# Patient Record
Sex: Female | Born: 1950 | Race: White | Hispanic: No | Marital: Married | Smoking: Never smoker
Health system: Southern US, Community
[De-identification: ages and names within clinical notes are randomized; demographics above are authoritative.]

## PROBLEM LIST (undated history)

## (undated) DIAGNOSIS — E785 Hyperlipidemia, unspecified: Secondary | ICD-10-CM

## (undated) DIAGNOSIS — I509 Heart failure, unspecified: Secondary | ICD-10-CM

## (undated) DIAGNOSIS — M1990 Unspecified osteoarthritis, unspecified site: Secondary | ICD-10-CM

## (undated) DIAGNOSIS — I10 Essential (primary) hypertension: Secondary | ICD-10-CM

## (undated) DIAGNOSIS — T7840XA Allergy, unspecified, initial encounter: Secondary | ICD-10-CM

## (undated) DIAGNOSIS — E119 Type 2 diabetes mellitus without complications: Secondary | ICD-10-CM

## (undated) HISTORY — DX: Hyperlipidemia, unspecified: E78.5

## (undated) HISTORY — DX: Essential (primary) hypertension: I10

## (undated) HISTORY — DX: Type 2 diabetes mellitus without complications: E11.9

## (undated) HISTORY — PX: TUBAL LIGATION: SHX77

## (undated) HISTORY — PX: CARDIAC SURGERY: SHX584

## (undated) HISTORY — DX: Allergy, unspecified, initial encounter: T78.40XA

## (undated) HISTORY — DX: Unspecified osteoarthritis, unspecified site: M19.90

## (undated) HISTORY — PX: CHOLECYSTECTOMY: SHX55

## (undated) HISTORY — DX: Heart failure, unspecified: I50.9

---

## 2004-02-09 ENCOUNTER — Ambulatory Visit: Payer: Self-pay

## 2004-08-21 ENCOUNTER — Ambulatory Visit: Payer: Self-pay

## 2005-01-09 ENCOUNTER — Ambulatory Visit: Payer: Self-pay | Admitting: Internal Medicine

## 2005-01-24 ENCOUNTER — Ambulatory Visit: Payer: Self-pay | Admitting: Internal Medicine

## 2005-03-05 ENCOUNTER — Ambulatory Visit: Payer: Self-pay

## 2006-01-28 ENCOUNTER — Ambulatory Visit: Payer: Self-pay | Admitting: Internal Medicine

## 2007-01-31 ENCOUNTER — Ambulatory Visit: Payer: Self-pay | Admitting: Internal Medicine

## 2008-02-02 ENCOUNTER — Ambulatory Visit: Payer: Self-pay | Admitting: Internal Medicine

## 2009-02-07 ENCOUNTER — Ambulatory Visit: Payer: Self-pay | Admitting: Internal Medicine

## 2009-05-27 ENCOUNTER — Inpatient Hospital Stay: Payer: Self-pay | Admitting: Internal Medicine

## 2009-06-21 ENCOUNTER — Ambulatory Visit: Payer: Self-pay | Admitting: Specialist

## 2009-07-10 ENCOUNTER — Ambulatory Visit: Payer: Self-pay | Admitting: Specialist

## 2010-02-23 ENCOUNTER — Ambulatory Visit: Payer: Self-pay | Admitting: Internal Medicine

## 2010-07-10 ENCOUNTER — Ambulatory Visit: Payer: Self-pay | Admitting: Internal Medicine

## 2011-02-26 ENCOUNTER — Ambulatory Visit: Payer: Self-pay | Admitting: Internal Medicine

## 2012-02-27 ENCOUNTER — Ambulatory Visit: Payer: Self-pay

## 2013-02-27 ENCOUNTER — Ambulatory Visit: Payer: Self-pay

## 2014-03-02 ENCOUNTER — Ambulatory Visit: Payer: Self-pay | Admitting: Internal Medicine

## 2015-02-15 ENCOUNTER — Other Ambulatory Visit: Payer: Self-pay | Admitting: Internal Medicine

## 2015-02-15 DIAGNOSIS — Z1231 Encounter for screening mammogram for malignant neoplasm of breast: Secondary | ICD-10-CM

## 2015-03-07 ENCOUNTER — Ambulatory Visit
Admission: RE | Admit: 2015-03-07 | Discharge: 2015-03-07 | Disposition: A | Discharge status: Home / Self Care | Payer: Medicare Other | Source: Ambulatory Visit | Attending: Internal Medicine | Admitting: Internal Medicine

## 2015-03-07 ENCOUNTER — Other Ambulatory Visit: Payer: Self-pay | Admitting: Internal Medicine

## 2015-03-07 DIAGNOSIS — Z1231 Encounter for screening mammogram for malignant neoplasm of breast: Secondary | ICD-10-CM | POA: Insufficient documentation

## 2016-03-05 ENCOUNTER — Other Ambulatory Visit: Payer: Self-pay | Admitting: Internal Medicine

## 2016-03-05 DIAGNOSIS — Z1231 Encounter for screening mammogram for malignant neoplasm of breast: Secondary | ICD-10-CM

## 2016-04-06 ENCOUNTER — Ambulatory Visit
Admission: RE | Admit: 2016-04-06 | Discharge: 2016-04-06 | Disposition: A | Discharge status: Home / Self Care | Payer: Medicare Other | Source: Ambulatory Visit | Attending: Internal Medicine | Admitting: Internal Medicine

## 2016-04-06 DIAGNOSIS — Z1231 Encounter for screening mammogram for malignant neoplasm of breast: Secondary | ICD-10-CM | POA: Insufficient documentation

## 2016-12-11 ENCOUNTER — Other Ambulatory Visit: Payer: Self-pay | Admitting: Internal Medicine

## 2016-12-11 DIAGNOSIS — Z1231 Encounter for screening mammogram for malignant neoplasm of breast: Secondary | ICD-10-CM

## 2017-04-08 ENCOUNTER — Ambulatory Visit: Payer: Medicare Other

## 2017-04-26 ENCOUNTER — Ambulatory Visit
Admission: RE | Admit: 2017-04-26 | Discharge: 2017-04-26 | Disposition: A | Discharge status: Home / Self Care | Payer: Medicare Other | Source: Ambulatory Visit | Attending: Internal Medicine | Admitting: Internal Medicine

## 2017-04-26 DIAGNOSIS — Z1231 Encounter for screening mammogram for malignant neoplasm of breast: Secondary | ICD-10-CM

## 2017-08-29 ENCOUNTER — Other Ambulatory Visit: Payer: Self-pay | Admitting: Internal Medicine

## 2017-08-29 DIAGNOSIS — N183 Chronic kidney disease, stage 3 unspecified: Secondary | ICD-10-CM

## 2017-09-04 ENCOUNTER — Ambulatory Visit
Admission: RE | Admit: 2017-09-04 | Discharge: 2017-09-04 | Disposition: A | Discharge status: Home / Self Care | Payer: Medicare Other | Source: Ambulatory Visit | Attending: Internal Medicine | Admitting: Internal Medicine

## 2017-09-04 DIAGNOSIS — N183 Chronic kidney disease, stage 3 unspecified: Secondary | ICD-10-CM

## 2018-02-14 ENCOUNTER — Other Ambulatory Visit: Payer: Self-pay | Admitting: Internal Medicine

## 2018-02-14 DIAGNOSIS — Z1231 Encounter for screening mammogram for malignant neoplasm of breast: Secondary | ICD-10-CM

## 2018-05-02 ENCOUNTER — Ambulatory Visit
Admission: RE | Admit: 2018-05-02 | Discharge: 2018-05-02 | Disposition: A | Discharge status: Home / Self Care | Payer: Medicare Other | Source: Ambulatory Visit | Attending: Internal Medicine | Admitting: Internal Medicine

## 2018-05-02 DIAGNOSIS — Z1231 Encounter for screening mammogram for malignant neoplasm of breast: Secondary | ICD-10-CM | POA: Diagnosis not present

## 2018-07-15 ENCOUNTER — Other Ambulatory Visit (INDEPENDENT_AMBULATORY_CARE_PROVIDER_SITE_OTHER): Payer: Self-pay | Admitting: Vascular Surgery

## 2018-07-15 ENCOUNTER — Ambulatory Visit (INDEPENDENT_AMBULATORY_CARE_PROVIDER_SITE_OTHER): Payer: Medicare Other

## 2018-07-15 ENCOUNTER — Ambulatory Visit (INDEPENDENT_AMBULATORY_CARE_PROVIDER_SITE_OTHER): Payer: Medicare Other | Admitting: Vascular Surgery

## 2018-07-15 ENCOUNTER — Other Ambulatory Visit: Payer: Self-pay

## 2018-07-15 ENCOUNTER — Encounter (INDEPENDENT_AMBULATORY_CARE_PROVIDER_SITE_OTHER): Payer: Self-pay | Admitting: Vascular Surgery

## 2018-07-15 VITALS — BP 135/75 | HR 71 | Resp 16 | Ht 65.0 in | Wt 131.6 lb

## 2018-07-15 DIAGNOSIS — Z79899 Other long term (current) drug therapy: Secondary | ICD-10-CM

## 2018-07-15 DIAGNOSIS — I739 Peripheral vascular disease, unspecified: Secondary | ICD-10-CM

## 2018-07-15 DIAGNOSIS — M79604 Pain in right leg: Secondary | ICD-10-CM | POA: Diagnosis not present

## 2018-07-15 DIAGNOSIS — E119 Type 2 diabetes mellitus without complications: Secondary | ICD-10-CM | POA: Diagnosis not present

## 2018-07-15 DIAGNOSIS — E785 Hyperlipidemia, unspecified: Secondary | ICD-10-CM | POA: Diagnosis not present

## 2018-07-15 DIAGNOSIS — M79605 Pain in left leg: Secondary | ICD-10-CM

## 2018-07-15 DIAGNOSIS — Z794 Long term (current) use of insulin: Secondary | ICD-10-CM

## 2018-07-15 DIAGNOSIS — M79606 Pain in leg, unspecified: Secondary | ICD-10-CM | POA: Insufficient documentation

## 2018-07-15 DIAGNOSIS — I10 Essential (primary) hypertension: Secondary | ICD-10-CM | POA: Diagnosis not present

## 2018-07-15 NOTE — Assessment & Plan Note (Signed)
blood glucose control important in reducing the progression of atherosclerotic disease. Also, involved in wound healing. On appropriate medications.  

## 2018-07-15 NOTE — Assessment & Plan Note (Signed)
ABIs today are normal at 1.03 on the right and 0.99 on the left.  No signs of PAD. She does complain of painful varicosities and we will be happy to check this in a few months with duplex.  Compression stockings, leg elevation and anti-inflammatories for pain.

## 2018-07-15 NOTE — Assessment & Plan Note (Signed)
lipid control important in reducing the progression of atherosclerotic disease. Continue statin therapy  

## 2018-07-15 NOTE — Progress Notes (Signed)
Patient ID: Vickie Phillips, female   DOB: 10/04/1950, 68 y.o.   MRN: [id number]  Chief Complaint  Patient presents with   New Patient (Initial Visit)    ref singh for PAD with abi    HPI Vickie Kochererry W Minahan is a 68 y.o. female.  I am asked to see the patient by Dr. Thedore MinsSingh for evaluation of PAD.  The patient says a home health nurse came out and did a test and told her she had at least moderate peripheral arterial disease.  This is very disconcerting to her.  She was not having any claudication symptoms.  She was not having any rest pain symptoms.  No history of ulceration or infection.  She does complain of some painful varicosities in both legs that she has had treated many years ago without great success.  These are persistent.  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  No history of blood clots or thrombophlebitis to her knowledge.  No fevers or chills.  We performed noninvasive studies today which showed normal ABIs of 1.03 on the right and 0.99 on the left with normal waveforms and digital pressures consistent with no arterial insufficiency..     Past Medical History:  Diagnosis Date   Allergy    Arthritis    CHF (congestive heart failure) (HCC)    Diabetes mellitus without complication (HCC)    Hyperlipidemia    Hypertension     Past Surgical History:  Procedure Laterality Date   CARDIAC SURGERY     CHOLECYSTECTOMY     TUBAL LIGATION      Family History Family History  Problem Relation Age of Onset   Breast cancer Mother [id number]   Breast cancer Maternal Aunt 60   No bleeding or clotting disorders  Social History Social History   Tobacco Use   Smoking status: Never Smoker   Smokeless tobacco: Never Used  Substance Use Topics   Alcohol use: Never    Frequency: Never   Drug use: Never    Allergies  Allergen Reactions   Atenolol Other (See Comments)    Other Reaction: RESP.DISTRESS   Beta Adrenergic Blockers Other (See Comments)    Other  Reaction: resp. distress   Clarithromycin Rash   Hydrocodone-Acetaminophen Other (See Comments)    Other Reaction: NIGHTMARES   Zolpidem Other (See Comments)    Other Reaction: HALLUCINATIONS & NIGHTMARES   Metronidazole Other (See Comments)   Insulin Detemir Itching and Rash   Insulin Glargine Itching and Rash    Current Outpatient Medications  Medication Sig Dispense Refill   acetaminophen (TYLENOL) 500 MG tablet Take by mouth.     amoxicillin (AMOXIL) 500 MG tablet 4 tablets po one hour before dental work.     Calcium Carbonate Antacid (TUMS PO) Take by mouth.     Calcium Carbonate-Vitamin D (CALCIUM HIGH POTENCY/VITAMIN D) 600-200 MG-UNIT TABS Take by mouth.     Cholecalciferol (VITAMIN D3) 25 MCG (1000 UT) CAPS Take by mouth daily.     digoxin (LANOXIN) 0.125 MG tablet TAKE 1 TABLET BY MOUTH  DAILY     diltiazem (CARDIZEM CD) 240 MG 24 hr capsule TAKE 1 CAPSULE BY MOUTH  DAILY     dofetilide (TIKOSYN) 250 MCG capsule Take by mouth.     furosemide (LASIX) 20 MG tablet      glipiZIDE (GLUCOTROL) 10 MG tablet TAKE 1 TABLET BY MOUTH TWO  TIMES DAILY BEFORE MEALS     insulin NPH Human (HUMULIN  N,NOVOLIN N) 100 UNIT/ML injection Inject 8 units in the morning and 40 units in the evening. Take at 8 AM and 8 PM.     Insulin Pen Needle (FIFTY50 PEN NEEDLES) 32G X 4 MM MISC Use 1 each 2 (two) times daily     magnesium oxide (MAG-OX) 400 MG tablet Take 400 mg by mouth daily.     metFORMIN (GLUCOPHAGE-XR) 500 MG 24 hr tablet TAKE 2 TABLETS BY MOUTH TWO TIMES DAILY     Omega-3 Fatty Acids (FISH OIL) 1000 MG CAPS Take 1,000 mg by mouth.     ONE TOUCH ULTRA TEST test strip      potassium chloride SA (K-DUR,KLOR-CON) 20 MEQ tablet      pravastatin (PRAVACHOL) 40 MG tablet      spironolactone (ALDACTONE) 25 MG tablet TAKE 1 TABLET BY MOUTH ONCE DAILY     tadalafil, PAH, (ADCIRCA) 20 MG tablet Take by mouth.     warfarin (COUMADIN) 1 MG tablet TAKE 1 AND 1/2 TABLETS BY    MOUTH DAILY ALONG WITH 5MG   TABLET AND 3MG  TABLET   (9.5MG  TOTAL DAILY)     warfarin (COUMADIN) 3 MG tablet      warfarin (COUMADIN) 5 MG tablet TAKE 1 TABLET BY MOUTH   DAILY ALONG WITH 3MG  TABLET AND 1 AND 1/2 OF 1MG  (9.5MG  TOTAL DAILY)     No current facility-administered medications for this visit.       REVIEW OF SYSTEMS (Negative unless checked)  Constitutional: [] Weight loss  [] Fever  [] Chills Cardiac: [] Chest pain   [] Chest pressure   [] Palpitations   [] Shortness of breath when laying flat   [] Shortness of breath at rest   [] Shortness of breath with exertion. Vascular:  [] Pain in legs with walking   [] Pain in legs at rest   [] Pain in legs when laying flat   [] Claudication   [] Pain in feet when walking  [] Pain in feet at rest  [] Pain in feet when laying flat   [] History of DVT   [] Phlebitis   [x] Swelling in legs   [x] Varicose veins   [] Non-healing ulcers Pulmonary:   [] Uses home oxygen   [] Productive cough   [] Hemoptysis   [] Wheeze  [] COPD   [] Asthma Neurologic:  [] Dizziness  [] Blackouts   [] Seizures   [] History of stroke   [] History of TIA  [] Aphasia   [] Temporary blindness   [] Dysphagia   [] Weakness or numbness in arms   [] Weakness or numbness in legs Musculoskeletal:  [x] Arthritis   [] Joint swelling   [] Joint pain   [] Low back pain Hematologic:  [] Easy bruising  [] Easy bleeding   [] Hypercoagulable state   [] Anemic  [] Hepatitis Gastrointestinal:  [] Blood in stool   [] Vomiting blood  [] Gastroesophageal reflux/heartburn   [] Abdominal pain Genitourinary:  [] Chronic kidney disease   [] Difficult urination  [] Frequent urination  [] Burning with urination   [] Hematuria Skin:  [] Rashes   [] Ulcers   [] Wounds Psychological:  [] History of anxiety   []  History of major depression.    Physical Exam BP 135/75 (BP Location: Right Arm)    Pulse 71    Resp 16    Ht 5\' 5"  (1.651 m)    Wt 131 lb 9.6 oz (59.7 kg)    BMI 21.90 kg/m  Gen:  WD/WN, NAD Head: [place]/AT, No temporalis  wasting. Ear/Nose/Throat: Hearing grossly intact, nares w/o erythema or drainage, oropharynx w/o Erythema/Exudate Eyes: Conjunctiva clear, sclera non-icteric  Neck: trachea midline.  No JVD.  Pulmonary:  Good air movement, respirations not  labored, no use of accessory muscles  Cardiac: RRR, no JVD Vascular: diffuse varicosities bilaterally L>R Vessel Right Left  Radial Palpable Palpable                          DP 2+ 2+  PT 2+  1+    Gastrointestinal:. No masses, surgical incisions, or scars. Musculoskeletal: M/S 5/5 throughout.  Extremities without ischemic changes.  No deformity or atrophy. No edema. Neurologic: Sensation grossly intact in extremities.  Symmetrical.  Speech is fluent. Motor exam as listed above. Psychiatric: Judgment intact, Mood & affect appropriate for pt's clinical situation. Dermatologic: No rashes or ulcers noted.  No cellulitis or open wounds.    Radiology No results found.  Labs No results found for this or any previous visit (from the past 2160 hour(s)).  Assessment/Plan:  Hyperlipidemia lipid control important in reducing the progression of atherosclerotic disease. Continue statin therapy   Essential hypertension blood pressure control important in reducing the progression of atherosclerotic disease. On appropriate oral medications.   Diabetes (HCC) blood glucose control important in reducing the progression of atherosclerotic disease. Also, involved in wound healing. On appropriate medications.   Leg pain ABIs today are normal at 1.03 on the right and 0.99 on the left.  No signs of PAD. She does complain of painful varicosities and we will be happy to check this in a few months with duplex.  Compression stockings, leg elevation and anti-inflammatories for pain.       Festus Barren 07/15/2018, 9:43 AM   This note was created with Dragon medical transcription system.  Any errors from dictation are unintentional.

## 2018-07-15 NOTE — Assessment & Plan Note (Signed)
blood pressure control important in reducing the progression of atherosclerotic disease. On appropriate oral medications.  

## 2018-07-15 NOTE — Patient Instructions (Signed)
Varicose Veins Varicose veins are veins that have become enlarged, bulged, and twisted. They most often appear in the legs. What are the causes? This condition is caused by damage to the valves in the vein. These valves help blood return to your heart. When they are damaged and they stop working properly, blood may flow backward and back up in the veins near the skin, causing the veins to get larger and appear twisted. The condition can result from any issue that causes blood to back up, like pregnancy, prolonged standing, or obesity. What increases the risk? This condition is more likely to develop in people who are:  On their feet a lot.  Pregnant.  Overweight. What are the signs or symptoms? Symptoms of this condition include:  Bulging, twisted, and bluish veins.  A feeling of heaviness. This may be worse at the end of the day.  Leg pain. This may be worse at the end of the day.  Swelling in the leg.  Changes in skin color over the veins. How is this diagnosed? This condition may be diagnosed based on your symptoms, a physical exam, and an ultrasound test. How is this treated? Treatment for this condition may involve:  Avoiding sitting or standing in one position for long periods of time.  Wearing compression stockings. These stockings help to prevent blood clots and reduce swelling in the legs.  Raising (elevating) the legs when resting.  Losing weight.  Exercising regularly. If you have persistent symptoms or want to improve the way your varicose veins look, you may choose to have a procedure to close the varicose veins off or to remove them. Treatments to close off the veins include:  Sclerotherapy. In this treatment, a solution is injected into a vein to close it off.  Laser treatment. In this treatment, the vein is heated with a laser to close it off.  Radiofrequency vein ablation. In this treatment, an electrical current produced by radio waves is used to close  off the vein. Treatments to remove the veins include:  Phlebectomy. In this treatment, the veins are removed through small incisions made over the veins.  Vein ligation and stripping. In this treatment, incisions are made over the veins. The veins are then removed after being tied (ligated) with stitches (sutures). Follow these instructions at home: Activity  Walk as much as possible. Walking increases blood flow. This helps blood return to the heart and takes pressure off your veins. It also increases your cardiovascular strength.  Follow your health care provider's instructions about exercising.  Do not stand or sit in one position for a long period of time.  Do not sit with your legs crossed.  Rest with your legs raised during the day. General instructions   Follow any diet instructions given to you by your health care provider.  Wear compression stockings as directed by your health care provider. Do not wear other kinds of tight clothing around your legs, pelvis, or waist.  Elevate your legs at night to above the level of your heart.  If you get a cut in the skin over the varicose vein and the vein bleeds: ? Lie down with your leg raised. ? Apply firm pressure to the cut with a clean cloth until the bleeding stops. ? Place a bandage (dressing) on the cut. Contact a health care provider if:  The skin around your varicose veins starts to break down.  You have pain, redness, tenderness, or hard swelling over a vein.  You   are uncomfortable because of pain.  You get a cut in the skin over a varicose vein and it will not stop bleeding. Summary  Varicose veins are veins that have become enlarged, bulged, and twisted. They most often appear in the legs.  This condition is caused by damage to the valves in the vein. These valves help blood return to your heart.  Treatment for this condition includes frequent movements, wearing compression stockings, losing weight, and  exercising regularly. In some cases, procedures are done to close off or remove the veins.  Treatment for this condition may include wearing compression stockings, elevating the legs, losing weight, and engaging in regular activity. In some cases, procedures are done to close off or remove the veins. This information is not intended to replace advice given to you by your health care provider. Make sure you discuss any questions you have with your health care provider. Document Released: 01/24/2005 Document Revised: 05/09/2016 Document Reviewed: 05/09/2016 [employer] Interactive Patient Education  2019 [employer]  

## 2018-10-17 ENCOUNTER — Ambulatory Visit (INDEPENDENT_AMBULATORY_CARE_PROVIDER_SITE_OTHER): Payer: Medicare Other | Admitting: Vascular Surgery

## 2018-10-17 ENCOUNTER — Encounter (INDEPENDENT_AMBULATORY_CARE_PROVIDER_SITE_OTHER): Payer: Medicare Other

## 2019-03-23 ENCOUNTER — Other Ambulatory Visit: Payer: Self-pay | Admitting: Internal Medicine

## 2019-03-23 DIAGNOSIS — Z1231 Encounter for screening mammogram for malignant neoplasm of breast: Secondary | ICD-10-CM

## 2019-05-29 ENCOUNTER — Ambulatory Visit
Admission: RE | Admit: 2019-05-29 | Discharge: 2019-05-29 | Disposition: A | Discharge status: Home / Self Care | Payer: Medicare Other | Source: Ambulatory Visit | Attending: Internal Medicine | Admitting: Internal Medicine

## 2019-05-29 DIAGNOSIS — Z1231 Encounter for screening mammogram for malignant neoplasm of breast: Secondary | ICD-10-CM | POA: Diagnosis not present

## 2020-04-25 ENCOUNTER — Other Ambulatory Visit: Payer: Self-pay | Admitting: Internal Medicine

## 2020-04-25 DIAGNOSIS — Z1231 Encounter for screening mammogram for malignant neoplasm of breast: Secondary | ICD-10-CM

## 2020-05-31 ENCOUNTER — Other Ambulatory Visit: Payer: Self-pay

## 2020-05-31 ENCOUNTER — Ambulatory Visit
Admission: RE | Admit: 2020-05-31 | Discharge: 2020-05-31 | Disposition: A | Discharge status: Home / Self Care | Payer: Medicare Other | Source: Ambulatory Visit | Attending: Internal Medicine | Admitting: Internal Medicine

## 2020-05-31 DIAGNOSIS — Z1231 Encounter for screening mammogram for malignant neoplasm of breast: Secondary | ICD-10-CM | POA: Insufficient documentation

## 2021-04-08 ENCOUNTER — Emergency Department: Payer: Medicare Other

## 2021-04-08 ENCOUNTER — Observation Stay
Admission: EM | Admit: 2021-04-08 | Discharge: 2021-04-09 | Disposition: A | Discharge status: Home / Self Care | Payer: Medicare Other | Attending: Student | Admitting: Student

## 2021-04-08 ENCOUNTER — Encounter: Payer: Self-pay | Admitting: Emergency Medicine

## 2021-04-08 DIAGNOSIS — Z7901 Long term (current) use of anticoagulants: Secondary | ICD-10-CM | POA: Insufficient documentation

## 2021-04-08 DIAGNOSIS — R5383 Other fatigue: Secondary | ICD-10-CM | POA: Diagnosis present

## 2021-04-08 DIAGNOSIS — I110 Hypertensive heart disease with heart failure: Secondary | ICD-10-CM | POA: Diagnosis not present

## 2021-04-08 DIAGNOSIS — I509 Heart failure, unspecified: Secondary | ICD-10-CM | POA: Insufficient documentation

## 2021-04-08 DIAGNOSIS — U071 COVID-19: Secondary | ICD-10-CM | POA: Diagnosis not present

## 2021-04-08 DIAGNOSIS — I480 Paroxysmal atrial fibrillation: Principal | ICD-10-CM | POA: Insufficient documentation

## 2021-04-08 DIAGNOSIS — E119 Type 2 diabetes mellitus without complications: Secondary | ICD-10-CM | POA: Diagnosis not present

## 2021-04-08 DIAGNOSIS — Z79899 Other long term (current) drug therapy: Secondary | ICD-10-CM | POA: Diagnosis not present

## 2021-04-08 DIAGNOSIS — Z7984 Long term (current) use of oral hypoglycemic drugs: Secondary | ICD-10-CM | POA: Diagnosis not present

## 2021-04-08 DIAGNOSIS — Z794 Long term (current) use of insulin: Secondary | ICD-10-CM | POA: Diagnosis not present

## 2021-04-08 DIAGNOSIS — E785 Hyperlipidemia, unspecified: Secondary | ICD-10-CM

## 2021-04-08 DIAGNOSIS — I4891 Unspecified atrial fibrillation: Secondary | ICD-10-CM | POA: Diagnosis present

## 2021-04-08 LAB — CBC WITH DIFFERENTIAL/PLATELET
Abs Immature Granulocytes: 0.04 10*3/uL (ref 0.00–0.07)
Basophils Absolute: 0.1 10*3/uL (ref 0.0–0.1)
Basophils Relative: 1 %
Eosinophils Absolute: 0.1 10*3/uL (ref 0.0–0.5)
Eosinophils Relative: 1 %
HCT: 35.3 % — ABNORMAL LOW (ref 36.0–46.0)
Hemoglobin: 11.7 g/dL — ABNORMAL LOW (ref 12.0–15.0)
Immature Granulocytes: 1 %
Lymphocytes Relative: 11 %
Lymphs Abs: 0.7 10*3/uL (ref 0.7–4.0)
MCH: 28.1 pg (ref 26.0–34.0)
MCHC: 33.1 g/dL (ref 30.0–36.0)
MCV: 84.7 fL (ref 80.0–100.0)
Monocytes Absolute: 0.7 10*3/uL (ref 0.1–1.0)
Monocytes Relative: 11 %
Neutro Abs: 4.6 10*3/uL (ref 1.7–7.7)
Neutrophils Relative %: 75 %
Platelets: 219 10*3/uL (ref 150–400)
RBC: 4.17 MIL/uL (ref 3.87–5.11)
RDW: 13.6 % (ref 11.5–15.5)
WBC: 6.1 10*3/uL (ref 4.0–10.5)
nRBC: 0 % (ref 0.0–0.2)

## 2021-04-08 LAB — PROTIME-INR
INR: 1.9 — ABNORMAL HIGH (ref 0.8–1.2)
Prothrombin Time: 21.7 seconds — ABNORMAL HIGH (ref 11.4–15.2)

## 2021-04-08 LAB — LACTATE DEHYDROGENASE: LDH: 375 U/L — ABNORMAL HIGH (ref 98–192)

## 2021-04-08 LAB — FIBRINOGEN: Fibrinogen: 496 mg/dL — ABNORMAL HIGH (ref 210–475)

## 2021-04-08 LAB — BASIC METABOLIC PANEL
Anion gap: 9 (ref 5–15)
BUN: 19 mg/dL (ref 8–23)
CO2: 21 mmol/L — ABNORMAL LOW (ref 22–32)
Calcium: 8.5 mg/dL — ABNORMAL LOW (ref 8.9–10.3)
Chloride: 101 mmol/L (ref 98–111)
Creatinine, Ser: 0.93 mg/dL (ref 0.44–1.00)
GFR, Estimated: >60 mL/min (ref >60)
Glucose, Bld: 188 mg/dL — ABNORMAL HIGH (ref 70–99)
Potassium: 4.1 mmol/L (ref 3.5–5.1)
Sodium: 131 mmol/L — ABNORMAL LOW (ref 135–145)

## 2021-04-08 LAB — DIGOXIN LEVEL: Digoxin Level: <0.2 ng/mL — ABNORMAL LOW (ref 0.8–2.0)

## 2021-04-08 LAB — RESP PANEL BY RT-PCR (FLU A&B, COVID) ARPGX2
Influenza A by PCR: NEGATIVE
Influenza B by PCR: NEGATIVE
SARS Coronavirus 2 by RT PCR: POSITIVE — AB

## 2021-04-08 LAB — D-DIMER, QUANTITATIVE: D-Dimer, Quant: 1.13 ug/mL-FEU — ABNORMAL HIGH (ref 0.00–0.50)

## 2021-04-08 LAB — TROPONIN I (HIGH SENSITIVITY): Troponin I (High Sensitivity): 11 ng/L (ref <18)

## 2021-04-08 LAB — LACTIC ACID, PLASMA: Lactic Acid, Venous: 1.2 mmol/L (ref 0.5–1.9)

## 2021-04-08 LAB — PROCALCITONIN: Procalcitonin: <0.1 ng/mL

## 2021-04-08 LAB — FERRITIN: Ferritin: 140 ng/mL (ref 11–307)

## 2021-04-08 LAB — C-REACTIVE PROTEIN: CRP: 4.1 mg/dL — ABNORMAL HIGH (ref <1.0)

## 2021-04-08 LAB — BRAIN NATRIURETIC PEPTIDE: B Natriuretic Peptide: 225 pg/mL — ABNORMAL HIGH (ref 0.0–100.0)

## 2021-04-08 MED ORDER — HYDROCOD POLST-CPM POLST ER 10-8 MG/5ML PO SUER: 5.0000 mL | Freq: Two times a day (BID) | ORAL | Status: DC | PRN

## 2021-04-08 MED ORDER — DULAGLUTIDE 1.5 MG/0.5ML ~~LOC~~ SOAJ: 1.5000 mg | SUBCUTANEOUS | Status: DC

## 2021-04-08 MED ORDER — ASPIRIN EC 81 MG PO TBEC
81.0000 mg | DELAYED_RELEASE_TABLET | Freq: Every day | ORAL | Status: DC
  Filled 2021-04-08: qty 1

## 2021-04-08 MED ORDER — WARFARIN - PHYSICIAN DOSING INPATIENT
Freq: Every day | Status: DC
  Filled 2021-04-08: qty 1

## 2021-04-08 MED ORDER — DILTIAZEM HCL 25 MG/5ML IV SOLN
10.0000 mg | Freq: Once | INTRAVENOUS | Status: AC
  Administered 2021-04-08: 10 mg via INTRAVENOUS
  Filled 2021-04-08: qty 5

## 2021-04-08 MED ORDER — PRAVASTATIN SODIUM 40 MG PO TABS
40.0000 mg | ORAL_TABLET | Freq: Every day | ORAL | Status: DC
  Administered 2021-04-08: 40 mg via ORAL
  Filled 2021-04-08: qty 2

## 2021-04-08 MED ORDER — WARFARIN SODIUM 7.5 MG PO TABS
7.5000 mg | ORAL_TABLET | Freq: Every day | ORAL | Status: DC
  Filled 2021-04-08: qty 1

## 2021-04-08 MED ORDER — CALCIUM CARBONATE ANTACID 500 MG PO CHEW: 1.0000 | CHEWABLE_TABLET | Freq: Three times a day (TID) | ORAL | Status: DC | PRN

## 2021-04-08 MED ORDER — INSULIN GLARGINE-YFGN 100 UNIT/ML ~~LOC~~ SOLN
15.0000 [IU] | Freq: Every day | SUBCUTANEOUS | Status: DC
  Administered 2021-04-08: 15 [IU] via SUBCUTANEOUS
  Filled 2021-04-08 (×2): qty 0.15

## 2021-04-08 MED ORDER — GLIPIZIDE 10 MG PO TABS
10.0000 mg | ORAL_TABLET | Freq: Every day | ORAL | Status: DC
  Administered 2021-04-08: 10 mg via ORAL
  Filled 2021-04-08 (×2): qty 1

## 2021-04-08 MED ORDER — ZINC SULFATE 220 (50 ZN) MG PO CAPS
220.0000 mg | ORAL_CAPSULE | Freq: Every day | ORAL | Status: DC
  Administered 2021-04-08 – 2021-04-09 (×2): 220 mg via ORAL
  Filled 2021-04-08 (×2): qty 1

## 2021-04-08 MED ORDER — SPIRONOLACTONE 25 MG PO TABS
25.0000 mg | ORAL_TABLET | Freq: Every day | ORAL | Status: DC
  Administered 2021-04-08 – 2021-04-09 (×2): 25 mg via ORAL
  Filled 2021-04-08 (×2): qty 1

## 2021-04-08 MED ORDER — WARFARIN SODIUM 7.5 MG PO TABS
9.5000 mg | ORAL_TABLET | Freq: Every day | ORAL | Status: DC
  Administered 2021-04-08: 9.5 mg via ORAL
  Filled 2021-04-08 (×2): qty 1

## 2021-04-08 MED ORDER — TADALAFIL (PAH) 20 MG PO TABS
20.0000 mg | ORAL_TABLET | Freq: Every day | ORAL | Status: DC
  Administered 2021-04-08 – 2021-04-09 (×2): 20 mg via ORAL
  Filled 2021-04-08 (×2): qty 1

## 2021-04-08 MED ORDER — ASCORBIC ACID 500 MG PO TABS
500.0000 mg | ORAL_TABLET | Freq: Every day | ORAL | Status: DC
  Administered 2021-04-08 – 2021-04-09 (×2): 500 mg via ORAL
  Filled 2021-04-08 (×2): qty 1

## 2021-04-08 MED ORDER — ACETAMINOPHEN 325 MG PO TABS: 650.0000 mg | ORAL_TABLET | Freq: Four times a day (QID) | ORAL | Status: DC | PRN

## 2021-04-08 MED ORDER — GUAIFENESIN-DM 100-10 MG/5ML PO SYRP: 10.0000 mL | ORAL_SOLUTION | ORAL | Status: DC | PRN

## 2021-04-08 MED ORDER — DILTIAZEM HCL-DEXTROSE 125-5 MG/125ML-% IV SOLN (PREMIX)
5.0000 mg/h | INTRAVENOUS | Status: DC
  Administered 2021-04-08: 5 mg/h via INTRAVENOUS
  Filled 2021-04-08: qty 125

## 2021-04-08 MED ORDER — MAGNESIUM OXIDE -MG SUPPLEMENT 400 (240 MG) MG PO TABS
400.0000 mg | ORAL_TABLET | Freq: Every day | ORAL | Status: DC
  Administered 2021-04-08 – 2021-04-09 (×2): 400 mg via ORAL
  Filled 2021-04-08 (×2): qty 1

## 2021-04-08 MED ORDER — FUROSEMIDE 20 MG PO TABS
20.0000 mg | ORAL_TABLET | Freq: Every day | ORAL | Status: DC
  Administered 2021-04-08 – 2021-04-09 (×2): 20 mg via ORAL
  Filled 2021-04-08 (×2): qty 1

## 2021-04-08 MED ORDER — SODIUM CHLORIDE 0.9 % IV SOLN
100.0000 mg | Freq: Every day | INTRAVENOUS | Status: DC
  Filled 2021-04-08: qty 20

## 2021-04-08 MED ORDER — FAMOTIDINE 20 MG PO TABS
20.0000 mg | ORAL_TABLET | Freq: Two times a day (BID) | ORAL | Status: DC
  Administered 2021-04-08 – 2021-04-09 (×2): 20 mg via ORAL
  Filled 2021-04-08 (×2): qty 1

## 2021-04-08 MED ORDER — MAGNESIUM HYDROXIDE 400 MG/5ML PO SUSP: 30.0000 mL | Freq: Every day | ORAL | Status: DC | PRN

## 2021-04-08 MED ORDER — SODIUM CHLORIDE 0.9 % IV SOLN: INTRAVENOUS | Status: DC

## 2021-04-08 MED ORDER — TRAZODONE HCL 50 MG PO TABS: 25.0000 mg | ORAL_TABLET | Freq: Every evening | ORAL | Status: DC | PRN

## 2021-04-08 MED ORDER — DILTIAZEM HCL ER COATED BEADS 180 MG PO CP24
360.0000 mg | ORAL_CAPSULE | Freq: Once | ORAL | Status: AC
  Administered 2021-04-08: 360 mg via ORAL
  Filled 2021-04-08: qty 2

## 2021-04-08 MED ORDER — SODIUM CHLORIDE 0.9 % IV BOLUS
250.0000 mL | Freq: Once | INTRAVENOUS | Status: AC
  Administered 2021-04-08: 250 mL via INTRAVENOUS

## 2021-04-08 MED ORDER — SODIUM CHLORIDE 0.9 % IV SOLN
200.0000 mg | Freq: Once | INTRAVENOUS | Status: DC
  Filled 2021-04-08: qty 40

## 2021-04-08 MED ORDER — ONDANSETRON HCL 4 MG/2ML IJ SOLN: 4.0000 mg | Freq: Four times a day (QID) | INTRAMUSCULAR | Status: DC | PRN

## 2021-04-08 MED ORDER — DOFETILIDE 250 MCG PO CAPS
250.0000 ug | ORAL_CAPSULE | Freq: Two times a day (BID) | ORAL | Status: DC
Start: 2021-04-08 — End: 2021-04-09
  Administered 2021-04-08 – 2021-04-09 (×3): 250 ug via ORAL
  Filled 2021-04-08 (×6): qty 1

## 2021-04-08 MED ORDER — OMEGA-3-ACID ETHYL ESTERS 1 G PO CAPS
1.0000 g | ORAL_CAPSULE | Freq: Every day | ORAL | Status: DC
  Administered 2021-04-08 – 2021-04-09 (×2): 1 g via ORAL
  Filled 2021-04-08 (×3): qty 1

## 2021-04-08 MED ORDER — ONDANSETRON HCL 4 MG PO TABS: 4.0000 mg | ORAL_TABLET | Freq: Four times a day (QID) | ORAL | Status: DC | PRN

## 2021-04-08 MED ORDER — VITAMIN D 25 MCG (1000 UNIT) PO TABS
1000.0000 [IU] | ORAL_TABLET | Freq: Every day | ORAL | Status: DC
  Administered 2021-04-08 – 2021-04-09 (×2): 1000 [IU] via ORAL
  Filled 2021-04-08 (×2): qty 1

## 2021-04-08 MED ORDER — DILTIAZEM HCL 25 MG/5ML IV SOLN
10.0000 mg | Freq: Once | INTRAVENOUS | Status: AC
  Administered 2021-04-08: 10 mg via INTRAVENOUS

## 2021-04-08 MED ORDER — ENOXAPARIN SODIUM 30 MG/0.3ML IJ SOSY: 30.0000 mg | PREFILLED_SYRINGE | INTRAMUSCULAR | Status: DC

## 2021-04-08 NOTE — ED Provider Notes (Signed)
[facility] Emergency Department Provider Note   ____________________________________________   Event Date/Time   First MD Initiated Contact with Patient 04/08/21 1022     (approximate)  I have reviewed the triage vital signs and the nursing notes.   HISTORY  Chief Complaint No chief complaint on file.    HPI Rene Kochererry W Crock is a [age] y.o. female here for elevated heart rate  Patient reports she noticed her heart rate was fast and a bit irregular again.  She was in [facility] and had atrial flutter, her pacemaker site  She home tested positive for COVID a couple days ago.  She and her husband both developed feeling of fatigue.  She also had a slight sore throat and decreased appetite starting around Wednesday and Thursday of this week.  Overall though the symptoms are proved, she is not feeling short of breath.  She has been slightly decreased in her normal intake but still drinking fluids well  No chest pain.  Denies trouble breathing.  No weakness except for just slight fatigue.  Concerned now that she may be "back in a flutter" and previously had to do 2 cardioversions and had a several week stay at [facility].  [facility]  No longer on digoxin.  Takes tykosin and and Cardizem not had any of her morning medicine  Past Medical History:  Diagnosis Date   Allergy    Arthritis    CHF (congestive heart failure) (HCC)    Diabetes mellitus without complication (HCC)    Hyperlipidemia    Hypertension     Patient Active Problem List   Diagnosis Date Noted   Atrial fibrillation with RVR (HCC) 04/08/2021   Hyperlipidemia 07/15/2018   Essential hypertension 07/15/2018   Diabetes (HCC) 07/15/2018   Leg pain 07/15/2018    Past Surgical History:  Procedure Laterality Date   CARDIAC SURGERY     CHOLECYSTECTOMY     TUBAL LIGATION      Prior to Admission medications   Medication Sig Start Date End Date Taking? Authorizing  Provider  acetaminophen (TYLENOL) 500 MG tablet Take by mouth.    [provider]  amoxicillin (AMOXIL) 500 MG tablet 4 tablets po one hour before dental work. 05/29/12   [provider]  Calcium Carbonate Antacid (TUMS PO) Take by mouth.    [provider]  Calcium Carbonate-Vitamin D (CALCIUM HIGH POTENCY/VITAMIN D) 600-200 MG-UNIT TABS Take by mouth.    [provider]  Cholecalciferol (VITAMIN D3) 25 MCG (1000 UT) CAPS Take by mouth daily.    [provider]  digoxin (LANOXIN) 0.125 MG tablet TAKE 1 TABLET BY MOUTH  DAILY 05/09/18   [provider]  diltiazem (CARDIZEM CD) 240 MG 24 hr capsule TAKE 1 CAPSULE BY MOUTH  DAILY 05/09/18 05/09/19  [provider]  dofetilide (TIKOSYN) 250 MCG capsule Take by mouth. 06/04/18   [provider]  furosemide (LASIX) 20 MG tablet  07/13/18   [provider]  glipiZIDE (GLUCOTROL) 10 MG tablet TAKE 1 TABLET BY MOUTH TWO  TIMES DAILY BEFORE MEALS 05/01/18   [provider]  insulin NPH Human (HUMULIN N,NOVOLIN N) 100 UNIT/ML injection Inject 8 units in the morning and 40 units in the evening. Take at 8 AM and 8 PM. 05/23/18   [provider]  Insulin Pen Needle (FIFTY50 PEN NEEDLES) 32G X 4 MM MISC Use 1 each 2 (two) times daily 05/01/17   [provider]  magnesium oxide (MAG-OX) 400 MG tablet Take 400 mg by mouth daily.    [provider]  metFORMIN (GLUCOPHAGE-XR) 500 MG 24 hr tablet TAKE 2 TABLETS BY MOUTH TWO TIMES DAILY 05/01/18   [provider]  Omega-3 Fatty Acids (FISH OIL) 1000 MG CAPS Take 1,000 mg by mouth.    [provider]  ONE TOUCH ULTRA TEST test strip  07/13/18   [provider]  potassium chloride SA (K-DUR,KLOR-CON) 20 MEQ tablet  07/13/18   [provider]  pravastatin (PRAVACHOL) 40 MG tablet  07/13/18   [provider]  spironolactone (ALDACTONE) 25 MG tablet TAKE 1 TABLET BY MOUTH ONCE  DAILY 09/11/17   [provider]  tadalafil, PAH, (ADCIRCA) 20 MG tablet Take by mouth. 03/05/18   [provider]  warfarin (COUMADIN) 1 MG tablet TAKE 1 AND 1/2 TABLETS BY   MOUTH DAILY ALONG WITH 5MG   TABLET AND 3MG  TABLET   (9.5MG  TOTAL DAILY) 05/09/18   [provider]  warfarin (COUMADIN) 3 MG tablet  05/15/18   [provider]  warfarin (COUMADIN) 5 MG tablet TAKE 1 TABLET BY MOUTH   DAILY ALONG WITH 3MG  TABLET AND 1 AND 1/2 OF 1MG  (9.5MG  TOTAL DAILY) 05/09/18   [provider]    Allergies Atenolol, Beta adrenergic blockers, Clarithromycin, Hydrocodone-acetaminophen, Zolpidem, Metronidazole, Insulin detemir, and Insulin glargine  Family History  Problem Relation Age of Onset   Breast cancer Mother 18   Breast cancer Maternal Aunt 60    Social History Social History   Tobacco Use   Smoking status: Never   Smokeless tobacco: Never  Substance Use Topics   Alcohol use: Never   Drug use: Never    Review of Systems Constitutional: No fever/chills but feeling fatigue and test positive COVID a couple days ago Eyes: No visual changes. ENT: No sore throat.  She did have a sore throat earlier in the week which is gone away Cardiovascular: Denies chest pain.  Pocket over the left upper chest is improving, doing daily bandaging, and it is bruised up for the last several weeks, and she has not seen any further worsening or drainage or surrounding redness or pain.  Lower right upper chest new pacemaker insertion site is been healing over without notable concern Respiratory: Denies shortness of breath.  No cough Gastrointestinal: No abdominal pain.   Genitourinary: Negative for dysuria. Musculoskeletal: Negative for back pain. Skin: Negative for rash. Neurological: Negative for areas of focal weakness or numbness.  Had a mild headache on Wednesday that is improving.    ____________________________________________   PHYSICAL EXAM:  VITAL  SIGNS: ED Triage Vitals  Enc Vitals Group     BP 04/08/21 1019 (!) 163/96     Pulse Rate 04/08/21 1017 (!) 105     Resp 04/08/21 1017 11     Temp 04/08/21 1017 98 F (36.7 C)     Temp Source 04/08/21 1017 Oral     SpO2 04/08/21 1017 98 %     Weight --      Height --      Head Circumference --      Peak Flow --      Pain Score 04/08/21 1018 0     Pain Loc --      Pain Edu? --      Excl. in [place]? --     Constitutional: Alert and oriented. Well appearing and in no acute distress. Eyes: Conjunctivae are normal. Head: Atraumatic. Nose: No  congestion/rhinnorhea. Mouth/Throat: Mucous membranes are moist. Neck: No stridor.  Cardiovascular: Moderately tachycardic rate, irregular rhythm. Grossly normal heart sounds except for some metallic valvular sound.  Good peripheral circulation.  Left upper chest hematoma with Steri-Strips over it, clean dry and intact surrounding.  A small punctate area of granulation noted that patient has been doing daily bandage changes without drainage.  Right upper chest pacemaker site clean dry intact Steri-Strips overlying no surrounding erythema. Respiratory: Normal respiratory effort.  No retractions. Lungs CTAB. Gastrointestinal: Soft and nontender. No distention. Musculoskeletal: No lower extremity tenderness nor edema. Neurologic:  Normal speech and language. No gross focal neurologic deficits are appreciated.  Skin:  Skin is warm, dry and intact. No rash noted. Psychiatric: Mood and affect are normal. Speech and behavior are normal.  ____________________________________________   LABS (all labs ordered are listed, but only abnormal results are displayed)  Labs Reviewed  RESP PANEL BY RT-PCR (FLU A&B, COVID) ARPGX2 - Abnormal; Notable for the following components:      Result Value   SARS Coronavirus 2 by RT PCR POSITIVE (*)    All other components within normal limits  CBC WITH DIFFERENTIAL/PLATELET - Abnormal; Notable for the following components:    Hemoglobin 11.7 (*)    HCT 35.3 (*)    All other components within normal limits  BASIC METABOLIC PANEL - Abnormal; Notable for the following components:   Sodium 131 (*)    CO2 21 (*)    Glucose, Bld 188 (*)    Calcium 8.5 (*)    All other components within normal limits  DIGOXIN LEVEL - Abnormal; Notable for the following components:   Digoxin Level <0.2 (*)    All other components within normal limits  LACTIC ACID, PLASMA  PROTIME-INR  CBC  CREATININE, SERUM  C-REACTIVE PROTEIN  BRAIN NATRIURETIC PEPTIDE  D-DIMER, QUANTITATIVE  FERRITIN  FIBRINOGEN  LACTATE DEHYDROGENASE  PROCALCITONIN  TROPONIN I (HIGH SENSITIVITY)   ____________________________________________  EKG  Reviewed inter by me at 1020 Heart rate 140 QRS 100 QTc 480 Atrial fibrillation, rapid ventricular response.  Some slight element noted in V2 and V3 that could be suggestive of underlying flutter but there is also irregularity.  My gestalt is that this is possibly represent of A. fib but a flutter could be present.  No obvious ischemic abnormality mild ST segment depressions are noted in lead II 3 in lateral precordial region ____________________________________________  RADIOLOGY  Chest x-ray reviewed negative for acute finding.  DG Chest Portable 1 View  Result Date: 04/08/2021 CLINICAL DATA:  Fever. EXAM: PORTABLE CHEST 1 VIEW COMPARISON:  May 31, 2009. FINDINGS: The heart size and mediastinal contours are within normal limits. Both lungs are clear. Right-sided pacemaker is unchanged in position. Status post cardiac valve repair. The visualized skeletal structures are unremarkable. IMPRESSION: No active disease. Electronically Signed   By: Marijo Conception M.D.   On: 04/08/2021 10:56    ____________________________________________   PROCEDURES  Procedure(s) performed: None  Procedures  Critical Care performed: Yes, see critical care note(s)  CRITICAL CARE Performed by: Delman Kitten   Total critical care time: 35 minutes  Critical care time was exclusive of separately billable procedures and treating other patients.  Critical care was necessary to treat or prevent imminent or life-threatening deterioration.  Critical care was time spent personally by me on the following activities: development of treatment plan with patient and/or surrogate as well as nursing, discussions with consultants, evaluation of patient's response to treatment, examination of  patient, obtaining history from patient or surrogate, ordering and performing treatments and interventions, ordering and review of laboratory studies, ordering and review of radiographic studies, pulse oximetry and re-evaluation of patient's condition.  ____________________________________________   INITIAL IMPRESSION / ASSESSMENT AND PLAN / ED COURSE  Pertinent labs & imaging results that were available during my care of the patient were reviewed by me and considered in my medical decision making (see chart for details).     Patient presents for rapid heart rate.  Indicative of recurrent A. fib or possibly flutter.  This is in the setting of symptoms of upper respiratory infection that seem to be relatively resolving but did test positive for COVID this week as well.  This would be to be approximately day 4, not currently on any COVID specific treatment  Will give patient's home Cardizem dose as well as small bolus of IV Cardizem for rate control.  Primarily symptoms seem to be related to her A. fib rapid ventricular response.  No associated chest pain no shortness of breath.  No clinical exam findings suggest PE.  She has a known history of atrial flutter followed by electrophysiology.  Is anticoagulated.  Clinical Course as of 04/08/21 1543  Sat Apr 08, 2021  1312 Case discussed with cardiology Dr. Cassie Freer.  He recommends repeat diltiazem bolus, administered her home tikosyn (dose affirmed with pharmacy and [facility]  record) [MQ]  1329 Patient resting, understanding of plan.  Discussed her COVID status, and weighing risks benefits of COVID-specific treatment patient would decline treatment such as back Paxlovid I discussed with her, and I think given the fact that pack [MQ]    Clinical Course User Index [MQ] Sharyn Creamer, MD   ----------------------------------------- 2:56 PM on 04/08/2021 ----------------------------------------- Patient resting without distress but still having what appears to be A. fib with RVR rate 105-140.  She is to this point received oral as well as IV antiarrhythmic.  I discussed with cardiology previously, and I will admit the patient for A. fib RVR (seems less likely to represent flutter) and COVID-19. Paxlovid contraindication on Tykosin.   Admission discussed with hospitalist Dr. Arville Care.     ____________________________________________   FINAL CLINICAL IMPRESSION(S) / ED DIAGNOSES  Final diagnoses:  COVID-19  Atrial fibrillation with rapid ventricular response (HCC)        Note:  This document was prepared using Dragon voice recognition software and may include unintentional dictation errors       Sharyn Creamer, MD 04/08/21 1544

## 2021-04-08 NOTE — ED Triage Notes (Signed)
Pt arrives via EMS from home with reports of tachycardia ranging from 120-190. Pacemaker placed in October. 500 cc NS given by EMS. Covid + since Wednesday. Denies any CP.

## 2021-04-08 NOTE — H&P (Signed)
[place]   PATIENT NAME: Vickie Phillips    MR#:  [id number]  DATE OF BIRTH:  03-Jan-1951  DATE OF ADMISSION:  04/08/2021  PRIMARY CARE PHYSICIAN: Idelle Crouch, MD   Patient is coming from: Home  REQUESTING/REFERRING PHYSICIAN: Delman Kitten, MD.  CHIEF COMPLAINT:  No chief complaint on file.   HISTORY OF PRESENT ILLNESS:  Vickie Phillips is a 70 y.o. Caucasian female with medical history significant for CHF, type 2 diabetes mellitus, hypertension and dyslipidemia, who presented to the emergency room with acute onset of palpitations without chest pain.  She denies any dyspnea orthopnea or paroxysmal nocturnal dyspnea.  She has been having mild cough every now and then without expectoration.  No nausea or vomiting or abdominal pain.  No dysuria, oliguria or hematuria or flank pain.  She denies any presyncope or syncope.  She has been having mild headache without dizziness or blurred vision.  ED Course: Upon presentation to the emergency room blood pressure was 163/96 with a heart rate of 105 and later 140 and she was noted to be in atrial fibrillation.   Labs revealed mild hyponatremia 131 and a CO2 of 21 with blood glucose of 188.  BNP was started 25 and LDH 375 with a hemoglobin of 11.7 and hematocrit 35.3.  COVID-19 PCR came back positive and influenza antigens are negative.    Chest x-ray showed no acute cardiopulmonary disease.    EKG showed atrial fibrillation with RVR of 137 with RSR-in V1 and V2.  The patient was given IV Cardizem bolus of 10 mg followed by IV Cardizem drip.  She was normal sinus rhythm during my interview at 87 on Cardizem drip.  She will be admitted to a progressive unit bed for further evaluation and management.  PAST MEDICAL HISTORY:   Past Medical History:  Diagnosis Date   Allergy    Arthritis    CHF (congestive heart failure) (HCC)    Diabetes mellitus without complication (HCC)    Hyperlipidemia    Hypertension     PAST SURGICAL  HISTORY:   Past Surgical History:  Procedure Laterality Date   CARDIAC SURGERY     CHOLECYSTECTOMY     TUBAL LIGATION      SOCIAL HISTORY:   Social History   Tobacco Use   Smoking status: Never   Smokeless tobacco: Never  Substance Use Topics   Alcohol use: Never    FAMILY HISTORY:   Family History  Problem Relation Age of Onset   Breast cancer Mother 45   Breast cancer Maternal Aunt 60    DRUG ALLERGIES:   Allergies  Allergen Reactions   Atenolol Other (See Comments)    Other Reaction: RESP.DISTRESS   Beta Adrenergic Blockers Other (See Comments)    Other Reaction: resp. distress   Clarithromycin Rash   Hydrocodone-Acetaminophen Other (See Comments)    Other Reaction: NIGHTMARES   Zolpidem Other (See Comments)    Other Reaction: HALLUCINATIONS & NIGHTMARES   Metronidazole Other (See Comments)   Insulin Detemir Itching and Rash   Insulin Glargine Itching and Rash    REVIEW OF SYSTEMS:   ROS As per history of present illness. All pertinent systems were reviewed above. Constitutional, HEENT, cardiovascular, respiratory, GI, GU, musculoskeletal, neuro, psychiatric, endocrine, integumentary and hematologic systems were reviewed and are otherwise negative/unremarkable except for positive findings mentioned above in the HPI.   MEDICATIONS AT HOME:   Prior to Admission medications   Medication Sig Start Date  End Date Taking? Authorizing Provider  acetaminophen (TYLENOL) 500 MG tablet Take by mouth.    [provider]  amoxicillin (AMOXIL) 500 MG tablet 4 tablets po one hour before dental work. 05/29/12   [provider]  Calcium Carbonate Antacid (TUMS PO) Take by mouth.    [provider]  Calcium Carbonate-Vitamin D (CALCIUM HIGH POTENCY/VITAMIN D) 600-200 MG-UNIT TABS Take by mouth.    [provider]  Cholecalciferol (VITAMIN D3) 25 MCG (1000 UT) CAPS Take by mouth daily.    [provider]  digoxin (LANOXIN) 0.125  MG tablet TAKE 1 TABLET BY MOUTH  DAILY 05/09/18   [provider]  diltiazem (CARDIZEM CD) 240 MG 24 hr capsule TAKE 1 CAPSULE BY MOUTH  DAILY 05/09/18 05/09/19  [provider]  dofetilide (TIKOSYN) 250 MCG capsule Take by mouth. 06/04/18   [provider]  furosemide (LASIX) 20 MG tablet  07/13/18   [provider]  glipiZIDE (GLUCOTROL) 10 MG tablet TAKE 1 TABLET BY MOUTH TWO  TIMES DAILY BEFORE MEALS 05/01/18   [provider]  insulin NPH Human (HUMULIN N,NOVOLIN N) 100 UNIT/ML injection Inject 8 units in the morning and 40 units in the evening. Take at 8 AM and 8 PM. 05/23/18   [provider]  Insulin Pen Needle (FIFTY50 PEN NEEDLES) 32G X 4 MM MISC Use 1 each 2 (two) times daily 05/01/17   [provider]  magnesium oxide (MAG-OX) 400 MG tablet Take 400 mg by mouth daily.    [provider]  metFORMIN (GLUCOPHAGE-XR) 500 MG 24 hr tablet TAKE 2 TABLETS BY MOUTH TWO TIMES DAILY 05/01/18   [provider]  Omega-3 Fatty Acids (FISH OIL) 1000 MG CAPS Take 1,000 mg by mouth.    [provider]  ONE TOUCH ULTRA TEST test strip  07/13/18   [provider]  potassium chloride SA (K-DUR,KLOR-CON) 20 MEQ tablet  07/13/18   [provider]  pravastatin (PRAVACHOL) 40 MG tablet  07/13/18   [provider]  spironolactone (ALDACTONE) 25 MG tablet TAKE 1 TABLET BY MOUTH ONCE DAILY 09/11/17   [provider]  tadalafil, PAH, (ADCIRCA) 20 MG tablet Take by mouth. 03/05/18   [provider]  warfarin (COUMADIN) 1 MG tablet TAKE 1 AND 1/2 TABLETS BY   MOUTH DAILY ALONG WITH 5MG   TABLET AND 3MG  TABLET   (9.5MG  TOTAL DAILY) 05/09/18   [provider]  warfarin (COUMADIN) 3 MG tablet  05/15/18   [provider]  warfarin (COUMADIN) 5 MG tablet TAKE 1 TABLET BY MOUTH   DAILY ALONG WITH 3MG  TABLET AND 1 AND 1/2 OF 1MG  (9.5MG  TOTAL DAILY) 05/09/18   [provider]       VITAL SIGNS:  Blood pressure 115/81, pulse 77, temperature 98 F (36.7 C), temperature source Oral, resp. rate 18, SpO2 98 %.  PHYSICAL EXAMINATION:  Physical Exam  GENERAL:  70 y.o.-year-old Caucasian female patient lying in the bed with no acute distress.  EYES: Pupils equal, round, reactive to light and accommodation. No scleral icterus. Extraocular muscles intact.  HEENT: Head atraumatic, normocephalic. Oropharynx and nasopharynx clear.  NECK:  Supple, no jugular venous distention. No thyroid enlargement, no tenderness.  LUNGS: Normal breath sounds bilaterally, no wheezing, rales,rhonchi or crepitation. No use of accessory muscles of respiration.  CARDIOVASCULAR: Regular rate and rhythm, S1, S2 normal. No murmurs, rubs, or gallops.  ABDOMEN: Soft, nondistended, nontender. Bowel sounds present. No organomegaly or mass.  EXTREMITIES:  No pedal edema, cyanosis, or clubbing.  NEUROLOGIC: Cranial nerves II through XII are intact. Muscle strength 5/5 in all extremities. Sensation intact. Gait not checked.  PSYCHIATRIC: The patient is alert and oriented x 3.  Normal affect and good eye contact. SKIN: No obvious rash, lesion, or ulcer.   LABORATORY PANEL:   CBC Recent Labs  Lab 04/08/21 1026  WBC 6.1  HGB 11.7*  HCT 35.3*  PLT 219   ------------------------------------------------------------------------------------------------------------------  Chemistries  Recent Labs  Lab 04/08/21 1026  NA 131*  K 4.1  CL 101  CO2 21*  GLUCOSE 188*  BUN 19  CREATININE 0.93  CALCIUM 8.5*   ------------------------------------------------------------------------------------------------------------------  Cardiac Enzymes No results for input(s): TROPONINI in the last 168 hours. ------------------------------------------------------------------------------------------------------------------  RADIOLOGY:  DG Chest Portable 1 View  Result Date: 04/08/2021 CLINICAL DATA:  Fever.  EXAM: PORTABLE CHEST 1 VIEW COMPARISON:  May 31, 2009. FINDINGS: The heart size and mediastinal contours are within normal limits. Both lungs are clear. Right-sided pacemaker is unchanged in position. Status post cardiac valve repair. The visualized skeletal structures are unremarkable. IMPRESSION: No active disease. Electronically Signed   By: Lupita Raider M.D.   On: 04/08/2021 10:56      IMPRESSION AND PLAN:  Principal Problem:   Atrial fibrillation with RVR (HCC)  1.  Paroxysmal atrial fibrillation with rapid ventricle response. - The patient will be admitted to a progressive care unit bed. - We will continue on IV Cardizem for now. - This can be later switched to p.o. Cardizem. - We will continue her anticoagulation with Coumadin after checking her INR. - We will continue Tikosyn. - Cardiology consult to be obtained. - Dr. Darrold Junker was notified about the patient.  2.  COVID-19 infection with minimal symptoms of cough status post vaccination with 2 vaccines and 1 booster. - She will be placed on vitamin C, vitamin D3 and zinc sulfate. - Mucolytic therapy will be provided for cough. - She refused IV remdesivir.  3.  Type II diabetes mellitus. - The patient will be placed on supplement coverage with NovoLog and will continue basal coverage. - We will continue p.o. glipizide and hold off metformin.  4.  Dyslipidemia. - We will continue statin therapy.  5.  Pulmonary hypertension. - We will continue tadalafil.  DVT prophylaxis: P.o. Coumadin while checking INR and holding it for INR more than 3. Code Status: full code. Family Communication:  The plan of care was discussed in details with the patient (and family). I answered all questions. The patient agreed to proceed with the above mentioned plan. Further management will depend upon hospital course. Disposition Plan: Back to previous home environment Consults called: Neurology. All the records are reviewed and case  discussed with ED provider.  Status is: Inpatient   Remains inpatient appropriate because:Ongoing diagnostic testing needed not appropriate for outpatient work up, Unsafe d/c plan, IV treatments appropriate due to intensity of illness or inability to take PO, and Inpatient level of care appropriate due to severity of illness   Dispo: The patient is from: Home              Anticipated d/c is to: Home              Patient currently is not medically stable to d/c.              Difficult to place patient: No  TOTAL TIME TAKING CARE OF THIS PATIENT: 55 minutes.     Ivar Domangue A Kym Scannell  M.D on 04/08/2021 at 4:02 PM  [facility]   From 7 PM-7 AM, contact night-coverage [email or web address]  CC: Primary care physician; Idelle Crouch, MD

## 2021-04-09 ENCOUNTER — Encounter: Payer: Self-pay | Admitting: Family Medicine

## 2021-04-09 ENCOUNTER — Other Ambulatory Visit: Payer: Self-pay

## 2021-04-09 DIAGNOSIS — I4891 Unspecified atrial fibrillation: Secondary | ICD-10-CM | POA: Diagnosis present

## 2021-04-09 LAB — CBC WITH DIFFERENTIAL/PLATELET
Abs Immature Granulocytes: 0.03 10*3/uL (ref 0.00–0.07)
Basophils Absolute: 0 10*3/uL (ref 0.0–0.1)
Basophils Relative: 1 %
Eosinophils Absolute: 0.1 10*3/uL (ref 0.0–0.5)
Eosinophils Relative: 2 %
HCT: 33 % — ABNORMAL LOW (ref 36.0–46.0)
Hemoglobin: 11.3 g/dL — ABNORMAL LOW (ref 12.0–15.0)
Immature Granulocytes: 1 %
Lymphocytes Relative: 20 %
Lymphs Abs: 0.9 10*3/uL (ref 0.7–4.0)
MCH: 27.9 pg (ref 26.0–34.0)
MCHC: 34.2 g/dL (ref 30.0–36.0)
MCV: 81.5 fL (ref 80.0–100.0)
Monocytes Absolute: 0.5 10*3/uL (ref 0.1–1.0)
Monocytes Relative: 12 %
Neutro Abs: 3 10*3/uL (ref 1.7–7.7)
Neutrophils Relative %: 64 %
Platelets: 256 10*3/uL (ref 150–400)
RBC: 4.05 MIL/uL (ref 3.87–5.11)
RDW: 13.6 % (ref 11.5–15.5)
WBC: 4.6 10*3/uL (ref 4.0–10.5)
nRBC: 0 % (ref 0.0–0.2)

## 2021-04-09 LAB — COMPREHENSIVE METABOLIC PANEL
ALT: 11 U/L (ref 0–44)
AST: 17 U/L (ref 15–41)
Albumin: 3.7 g/dL (ref 3.5–5.0)
Alkaline Phosphatase: 54 U/L (ref 38–126)
Anion gap: 8 (ref 5–15)
BUN: 17 mg/dL (ref 8–23)
CO2: 23 mmol/L (ref 22–32)
Calcium: 8.6 mg/dL — ABNORMAL LOW (ref 8.9–10.3)
Chloride: 102 mmol/L (ref 98–111)
Creatinine, Ser: 0.86 mg/dL (ref 0.44–1.00)
GFR, Estimated: >60 mL/min (ref >60)
Glucose, Bld: 99 mg/dL (ref 70–99)
Potassium: 4 mmol/L (ref 3.5–5.1)
Sodium: 133 mmol/L — ABNORMAL LOW (ref 135–145)
Total Bilirubin: 0.7 mg/dL (ref 0.3–1.2)
Total Protein: 6.6 g/dL (ref 6.5–8.1)

## 2021-04-09 LAB — PROTIME-INR
INR: 2.1 — ABNORMAL HIGH (ref 0.8–1.2)
Prothrombin Time: 23.6 seconds — ABNORMAL HIGH (ref 11.4–15.2)

## 2021-04-09 LAB — FERRITIN: Ferritin: 135 ng/mL (ref 11–307)

## 2021-04-09 LAB — C-REACTIVE PROTEIN: CRP: 3 mg/dL — ABNORMAL HIGH (ref <1.0)

## 2021-04-09 LAB — PHOSPHORUS: Phosphorus: 3.6 mg/dL (ref 2.5–4.6)

## 2021-04-09 LAB — D-DIMER, QUANTITATIVE: D-Dimer, Quant: 0.96 ug/mL-FEU — ABNORMAL HIGH (ref 0.00–0.50)

## 2021-04-09 LAB — MAGNESIUM: Magnesium: 1.9 mg/dL (ref 1.7–2.4)

## 2021-04-09 MED ORDER — TADALAFIL 20 MG PO TABS
20.0000 mg | ORAL_TABLET | Freq: Once | ORAL | Status: AC
  Administered 2021-04-09: 20 mg via ORAL
  Filled 2021-04-09: qty 1

## 2021-04-09 MED ORDER — DILTIAZEM HCL ER COATED BEADS 180 MG PO CP24
360.0000 mg | ORAL_CAPSULE | Freq: Every day | ORAL | Status: DC
  Administered 2021-04-09: 360 mg via ORAL
  Filled 2021-04-09: qty 2

## 2021-04-09 MED ORDER — DILTIAZEM HCL ER COATED BEADS 120 MG PO CP24
240.0000 mg | ORAL_CAPSULE | Freq: Every day | ORAL | Status: DC
Start: 2021-04-09 — End: 2021-04-09

## 2021-04-09 MED ORDER — TADALAFIL (PAH) 20 MG PO TABS
40.0000 mg | ORAL_TABLET | Freq: Every day | ORAL | Status: DC
  Filled 2021-04-09: qty 2

## 2021-04-09 NOTE — Care Management CC44 (Signed)
Condition Code 44 Documentation Completed  Patient Details  Name: Vickie Phillips MRN: [id number] Date of Birth: Sep 03, 1950   Condition Code 44 given:  Yes Patient signature on Condition Code 44 notice:  Yes Documentation of 2 MD's agreement:  Yes Code 44 added to claim:  Yes    Bing Quarry, RN 04/09/2021, 2:05 PM

## 2021-04-09 NOTE — Consult Note (Signed)
[facility] Cardiology  CARDIOLOGY CONSULT NOTE  Patient ID: Vickie Phillips MRN: [id number] DOB/AGE: Jul 11, 1950 70 y.o.  Admit date: 04/08/2021 Referring Physician [facility] Primary Physician [facility] Primary Cardiologist Romeo Apple Reason for Consultation atrial fibrillation  HPI: 70 year old female referred for atrial fibrillation with rapid ventricular response.  The patient has complex cardiac history.  She has history of mitral stenosis, status post open commissurotomy 1981 at [facility] followed by mitral valvuloplasty 2008 at [facility].  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 Silvestre Mesi at [facility] in 2011.  Tachybradycardia syndrome, status post maker, pacemaker erosion quiring lead extraction 03/03/2021 followed by chamber pacemaker implantation on the right side 03/07/2021.  Patient has paroxysmal atrial fibrillation, as post recent TEE/cardioversion 03/15/2021 on Tikosyn for rhythm control and warfarin for stroke prevention.  Patient was in her usual state of health until 04/07/2021, when she experienced sinus symptoms, used her husband's nasal spray, and approximately 30 minutes later developed tachycardia persisted until she presented to [facility] ED on 04/08/2021 where ECG revealed atrial fibrillation at a rate of 137 bpm.  Patient was treated with Cardizem bolus 10 mg, 15 mg, and placed on Cardizem drip with conversion to sinus rhythm.  The patient has remained in sinus rhythm overnight.  She tested positive for COVID-19, refuses IV remdesivir treatment.  Admission labs notable for INR of 2.1.  Review of systems complete and found to be negative unless listed above     Past Medical History:  Diagnosis Date   Allergy    Arthritis    CHF (congestive heart failure) (HCC)    Diabetes mellitus without complication (HCC)    Hyperlipidemia    Hypertension     Past Surgical History:  Procedure Laterality Date   CARDIAC SURGERY     CHOLECYSTECTOMY     TUBAL LIGATION      Medications Prior  to Admission  Medication Sig Dispense Refill Last Dose   Calcium Carbonate-Vitamin D 600-200 MG-UNIT TABS Take by mouth.   04/07/2021 at AM   Cholecalciferol (VITAMIN D3) 25 MCG (1000 UT) CAPS Take by mouth daily.   04/07/2021 at AM   diltiazem (TIAZAC) 360 MG 24 hr capsule Take 360 mg by mouth daily.   04/07/2021   dofetilide (TIKOSYN) 250 MCG capsule Take by mouth.   04/07/2021 at AM   Dulaglutide 1.5 MG/0.5ML SOPN Inject 1.5 mg into the skin once a week.   Past Week   furosemide (LASIX) 20 MG tablet    04/07/2021 at AM   glipiZIDE (GLUCOTROL) 10 MG tablet TAKE 1 TABLET BY MOUTH TWO  TIMES DAILY BEFORE MEALS   04/07/2021 at [facility]   Insulin Glargine (BASAGLAR KWIKPEN) 100 UNIT/ML Inject 15 Units into the skin at bedtime.   04/07/2021 at NIGHT   magnesium oxide (MAG-OX) 400 MG tablet Take 400 mg by mouth daily.   04/07/2021 at AM   metFORMIN (GLUCOPHAGE-XR) 500 MG 24 hr tablet TAKE 2 TABLETS BY MOUTH TWO TIMES DAILY   04/07/2021 at [facility]   metFORMIN (GLUCOPHAGE-XR) 500 MG 24 hr tablet Take 2 tablets by mouth 2 (two) times daily.   04/07/2021 at AM   Omega-3 Fatty Acids (FISH OIL) 1000 MG CAPS Take 1,000 mg by mouth.   04/07/2021 at AM   pravastatin (PRAVACHOL) 40 MG tablet    04/07/2021 at NIGHT   spironolactone (ALDACTONE) 25 MG tablet TAKE 1 TABLET BY MOUTH ONCE DAILY   04/07/2021 at AM   tadalafil, PAH, (ADCIRCA) 20 MG tablet Take  20 mg by mouth daily.   04/07/2021 at AM   warfarin (COUMADIN) 1 MG tablet TAKE 1 AND 1/2 TABLETS BY   MOUTH DAILY ALONG WITH 5MG   TABLET AND 3MG  TABLET   (9.5MG  TOTAL DAILY)   04/07/2021 at AM   warfarin (COUMADIN) 3 MG tablet    04/07/2021 at AM   warfarin (COUMADIN) 5 MG tablet TAKE 1 TABLET BY MOUTH   DAILY ALONG WITH 3MG  TABLET AND 1 AND 1/2 OF 1MG  (9.5MG  TOTAL DAILY)   04/07/2021 at AM   acetaminophen (TYLENOL) 500 MG tablet Take by mouth.   PRN at PRN   amoxicillin (AMOXIL) 500 MG tablet 4 tablets po one hour before dental work.   PRN at PRN   Calcium Carbonate Antacid  (TUMS PO) Take by mouth.   PRN at PRN   digoxin (LANOXIN) 0.125 MG tablet TAKE 1 TABLET BY MOUTH  DAILY (Patient not taking: Reported on 04/08/2021)   Not Taking   diltiazem (CARDIZEM CD) 240 MG 24 hr capsule TAKE 1 CAPSULE BY MOUTH  DAILY (Patient not taking: Reported on 04/08/2021)   Not Taking   insulin NPH Human (HUMULIN N,NOVOLIN N) 100 UNIT/ML injection Inject 8 units in the morning and 40 units in the evening. Take at 8 AM and 8 PM. (Patient not taking: Reported on 04/08/2021)   Not Taking   Insulin Pen Needle 32G X 4 MM MISC Use 1 each 2 (two) times daily      ONE TOUCH ULTRA TEST test strip       potassium chloride SA (K-DUR,KLOR-CON) 20 MEQ tablet  (Patient not taking: Reported on 04/08/2021)   Not Taking   Social History   Socioeconomic History   Marital status: Married    Spouse name: Not on file   Number of children: Not on file   Years of education: Not on file   Highest education level: Not on file  Occupational History   Not on file  Tobacco Use   Smoking status: Never   Smokeless tobacco: Never  Substance and Sexual Activity   Alcohol use: Never   Drug use: Never   Sexual activity: Not on file  Other Topics Concern   Not on file  Social History Narrative   Not on file   Social Determinants of Health   Financial Resource Strain: Not on file  Food Insecurity: Not on file  Transportation Needs: Not on file  Physical Activity: Not on file  Stress: Not on file  Social Connections: Not on file  Intimate Partner Violence: Not on file    Family History  Problem Relation Age of Onset   Breast cancer Mother 54   Breast cancer Maternal Aunt 60      Review of systems complete and found to be negative unless listed above      PHYSICAL EXAM  General: Well developed, well nourished, in no acute distress HEENT:  Normocephalic and atramatic Neck:  No JVD.  Lungs: Clear bilaterally to auscultation and percussion. Heart: HRRR . Normal S1 and S2 without gallops  or murmurs.  Abdomen: Bowel sounds are positive, abdomen soft and non-tender  Msk:  Back normal, normal gait. Normal strength and tone for age. Extremities: No clubbing, cyanosis or edema.   Neuro: Alert and oriented X 3. Psych:  Good affect, responds appropriately  Labs:   Lab Results  Component Value Date   WBC 4.6 04/09/2021   HGB 11.3 (L) 04/09/2021   HCT 33.0 (L) 04/09/2021  MCV 81.5 04/09/2021   PLT 256 04/09/2021    Recent Labs  Lab 04/09/21 0638  NA 133*  K 4.0  CL 102  CO2 23  BUN 17  CREATININE 0.86  CALCIUM 8.6*  PROT 6.6  BILITOT 0.7  ALKPHOS 54  ALT 11  AST 17  GLUCOSE 99   No results found for: CKTOTAL, CKMB, CKMBINDEX, TROPONINI No results found for: CHOL No results found for: HDL No results found for: LDLCALC No results found for: TRIG No results found for: CHOLHDL No results found for: LDLDIRECT    Radiology: DG Chest Portable 1 View  Result Date: 04/08/2021 CLINICAL DATA:  Fever. EXAM: PORTABLE CHEST 1 VIEW COMPARISON:  May 31, 2009. FINDINGS: The heart size and mediastinal contours are within normal limits. Both lungs are clear. Right-sided pacemaker is unchanged in position. Status post cardiac valve repair. The visualized skeletal structures are unremarkable. IMPRESSION: No active disease. Electronically Signed   By: Lupita Raider M.D.   On: 04/08/2021 10:56    EKG: Atrial fibrillation 137 bpm  ASSESSMENT AND PLAN:   1.  Paroxysmal atrial fibrillation, possibly triggered by antihistamine nasal spray, back in sinus rhythm after Cardizem bolus and drip, on warfarin for stroke prevention, and Tikosyn for rhythm control 2.  Tachybradycardia syndrome, status post dual-chamber pacemaker 03/07/2021, with history of left pocket erosion and lead extraction 03/03/2021 3.  Mitral valve replacement 2011, on warfarin, INR 2.1 4.  COVID-19, with mild symptoms, that is post vaccination x2 and booster, reluctant to receive IV remdesivir 5.  Pulmonary  hypertension / interstitial lung disease, on tadalafil  Recommendations  1.  Agree with current therapy 2.  Continue warfarin, target INR 2.5-3.5 3.  DC Cardizem drip 4.  Continue Cardizem CD 360 mg daily 5.  Advised patient to avoid antihistamine nasal spray 6.  Patient appears clinically stable, with COVID-19 with mild symptoms, may be discharged home from cardiovascular perspective, follow-up with Dr. Bernette Redbird, [facility] 1 to 2 weeks  Signed: Marcina Millard MD,PhD, [facility] 04/09/2021, 8:46 AM

## 2021-04-09 NOTE — Discharge Summary (Signed)
[facility] Discharge Summary   Patient: Vickie Phillips [id number]  PCP: Idelle Crouch, MD  Date of admission: 04/08/2021   Date of discharge:  04/09/2021     Discharge Diagnoses:  Principal Problem:   Atrial fibrillation with RVR ([address]) Active Problems:   Atrial fibrillation ([place])   Admitted From: [facility] Disposition:  Home   Recommendations for Outpatient Follow-up:  PCP: IN 1 WK F/U WITH cRADIO IN  1-2 WKS  Follow up LABS/TEST:  NONE   Diet recommendation: Cardiac diet  Activity: The patient is advised to gradually reintroduce usual activities, as tolerated  Discharge Condition: stable  Code Status: Full code   History of present illness: As per the H and P dictated on admission Hospital Course:  Vickie Phillips is a 70 y.o. Caucasian female with medical history significant for CHF, type 2 diabetes mellitus, hypertension and dyslipidemia, who presented to the emergency room with acute onset of palpitations without chest pain.  She denies any dyspnea orthopnea or paroxysmal nocturnal dyspnea.  She has been having mild cough every now and then without expectoration.  No nausea or vomiting or abdominal pain.  No dysuria, oliguria or hematuria or flank pain.  She denies any presyncope or syncope.  She has been having mild headache without dizziness or blurred vision. ED Course: Upon presentation to the emergency room blood pressure was 163/96 with a heart rate of 105 and later 140 and she was noted to be in atrial fibrillation.   Labs revealed mild hyponatremia 131 and a CO2 of 21 with blood glucose of 188.  BNP was started 25 and LDH 375 with a hemoglobin of 11.7 and hematocrit 35.3.  COVID-19 PCR came back positive and influenza antigens are negative.   Chest x-ray showed no acute cardiopulmonary disease.    EKG showed atrial fibrillation with RVR of 137 with RSR-in V1 and V2. The patient was given IV Cardizem bolus of 10 mg followed by IV Cardizem drip.  She was normal  sinus rhythm during my interview at 87 on Cardizem drip.  She will be admitted to a progressive unit bed for further evaluation and management.   Principal Problem:   Atrial fibrillation with RVR (HCC)   Paroxysmal atrial fibrillation with rapid ventricle response. Patient was started on Cardizem IV infusion, converted back to normal sinus rhythm.  Patient was transitioned to oral Cardizem home dose, Tikosyn was continued.  Patient was seen by cardiology, recommended to discharge and follow-up as an outpatient.  Resumed home medications and patient was discharged home to follow-up with her own cardiologist in 1 to 2 weeks.  Patient remained asymptomatic and she agreed with the discharge planning. COVID-19 infection with minimal symptoms of cough status post vaccination with 2 vaccines and 1 booster. S/P vitamin C, vitamin D3 and zinc sulfate given during hospital stay.  As needed mucolytic's given.  Patient had no respiratory symptoms, no hypoxia. She refused IV remdesivir. Type II diabetes mellitus, resumed home medications.  Patient was advised to monitor FSBG at home.  Diabetic diet and follow with PCP. Dyslipidemia, continue statin therapy. pulmonary hypertension, continue tadalafil.   Body mass index is 19.19 kg/m.  Nutrition Interventions:     Patient was ambulatory without any assistance. On the day of the discharge the patient's vitals were stable, and no other acute medical condition were reported by patient. the patient was felt safe to be discharge at Home.  Consultants: Cardiology Procedures: none  Discharge Exam: General: Appear in no distress,  no Rash; Oral Mucosa Clear, moist. Cardiovascular: S1 and S2 Present, no Murmur, Respiratory: normal respiratory effort, Bilateral Air entry present and no Crackles, no wheezes Abdomen: Bowel Sound present, Soft and no tenderness, no hernia Extremities: no Pedal edema, no calf tenderness Neurology: alert and oriented to time, place,  and person affect appropriate.  Filed Weights   04/08/21 1714 04/09/21 0200  Weight: 52.6 kg 52.3 kg   Vitals:   04/09/21 0845 04/09/21 1226  BP: (!) 154/80 136/78  Pulse: 87 86  Resp: 18 18  Temp: 98.1 F (36.7 C) 98.4 F (36.9 C)  SpO2: 100% 100%    DISCHARGE MEDICATION: Allergies as of 04/09/2021       Reactions   Atenolol Other (See Comments)   Other Reaction: RESP.DISTRESS   Beta Adrenergic Blockers Other (See Comments)   Other Reaction: resp. distress   Clarithromycin Rash   Hydrocodone-acetaminophen Other (See Comments)   Other Reaction: NIGHTMARES   Zolpidem Other (See Comments)   Other Reaction: HALLUCINATIONS & NIGHTMARES   Metronidazole Other (See Comments)   Insulin Detemir Itching, Rash   Insulin Glargine Itching, Rash        Medication List     STOP taking these medications    diltiazem 240 MG 24 hr capsule Commonly known as: CARDIZEM CD       TAKE these medications    acetaminophen 500 MG tablet Commonly known as: TYLENOL Take by mouth.   amoxicillin 500 MG tablet Commonly known as: AMOXIL 4 tablets po one hour before dental work.   Basaglar KwikPen 100 UNIT/ML Inject 15 Units into the skin at bedtime.   Calcium Carbonate-Vitamin D 600-200 MG-UNIT Tabs Take by mouth.   diltiazem 360 MG 24 hr capsule Commonly known as: TIAZAC Take 360 mg by mouth daily.   dofetilide 250 MCG capsule Commonly known as: TIKOSYN Take by mouth.   Dulaglutide 1.5 MG/0.5ML Sopn Inject 1.5 mg into the skin once a week.   Fish Oil 1000 MG Caps Take 1,000 mg by mouth.   furosemide 20 MG tablet Commonly known as: LASIX   glipiZIDE 10 MG tablet Commonly known as: GLUCOTROL TAKE 1 TABLET BY MOUTH TWO  TIMES DAILY BEFORE MEALS   Insulin Pen Needle 32G X 4 MM Misc Use 1 each 2 (two) times daily   magnesium oxide 400 MG tablet Commonly known as: MAG-OX Take 400 mg by mouth daily.   metFORMIN 500 MG 24 hr tablet Commonly known as:  GLUCOPHAGE-XR TAKE 2 TABLETS BY MOUTH TWO TIMES DAILY   metFORMIN 500 MG 24 hr tablet Commonly known as: GLUCOPHAGE-XR Take 2 tablets by mouth 2 (two) times daily.   ONE TOUCH ULTRA TEST test strip Generic drug: glucose blood   pravastatin 40 MG tablet Commonly known as: PRAVACHOL   spironolactone 25 MG tablet Commonly known as: ALDACTONE TAKE 1 TABLET BY MOUTH ONCE DAILY   tadalafil (PAH) 20 MG tablet Commonly known as: ADCIRCA Take 40 mg by mouth daily.   TUMS PO Take by mouth.   Vitamin D3 25 MCG (1000 UT) Caps Take by mouth daily.   warfarin 1 MG tablet Commonly known as: COUMADIN TAKE 1 AND 1/2 TABLETS BY   MOUTH DAILY ALONG WITH 5MG   TABLET AND 3MG  TABLET   (9.5MG  TOTAL DAILY)   warfarin 5 MG tablet Commonly known as: COUMADIN TAKE 1 TABLET BY MOUTH   DAILY ALONG WITH 3MG  TABLET AND 1 AND 1/2 OF 1MG  (9.5MG  TOTAL DAILY)   warfarin 3 MG  tablet Commonly known as: COUMADIN       Allergies  Allergen Reactions   Atenolol Other (See Comments)    Other Reaction: RESP.DISTRESS   Beta Adrenergic Blockers Other (See Comments)    Other Reaction: resp. distress   Clarithromycin Rash   Hydrocodone-Acetaminophen Other (See Comments)    Other Reaction: NIGHTMARES   Zolpidem Other (See Comments)    Other Reaction: HALLUCINATIONS & NIGHTMARES   Metronidazole Other (See Comments)   Insulin Detemir Itching and Rash   Insulin Glargine Itching and Rash   Discharge Instructions     Call MD for:  difficulty breathing, headache or visual disturbances   Complete by: As directed    Call MD for:  extreme fatigue   Complete by: As directed    Call MD for:  persistant dizziness or light-headedness   Complete by: As directed    Call MD for:  persistant nausea and vomiting   Complete by: As directed    Call MD for:  severe uncontrolled pain   Complete by: As directed    Call MD for:  temperature >100.4   Complete by: As directed    Diet - low sodium heart healthy    Complete by: As directed    Discharge instructions   Complete by: As directed    Follow-up with PCP in 1 week Follow-up with cardiology in 1 to 2 weeks for paroxysmal A. fib   Increase activity slowly   Complete by: As directed        The results of significant diagnostics from this hospitalization (including imaging, microbiology, ancillary and laboratory) are listed below for reference.    Significant Diagnostic Studies: DG Chest Portable 1 View  Result Date: 04/08/2021 CLINICAL DATA:  Fever. EXAM: PORTABLE CHEST 1 VIEW COMPARISON:  May 31, 2009. FINDINGS: The heart size and mediastinal contours are within normal limits. Both lungs are clear. Right-sided pacemaker is unchanged in position. Status post cardiac valve repair. The visualized skeletal structures are unremarkable. IMPRESSION: No active disease. Electronically Signed   By: Marijo Conception M.D.   On: 04/08/2021 10:56    Microbiology: Recent Results (from the past 240 hour(s))  Resp Panel by RT-PCR (Flu A&B, Covid) Nasopharyngeal Swab     Status: Abnormal   Collection Time: 04/08/21 10:26 AM   Specimen: Nasopharyngeal Swab; Nasopharyngeal(NP) swabs in vial transport medium  Result Value Ref Range Status   SARS Coronavirus 2 by RT PCR POSITIVE (A) NEGATIVE Final    Comment: RESULT CALLED TO, READ BACK BY AND VERIFIED WITH: MAGGIE BARBER 04/08/21 @ 1137 BY SB (NOTE) SARS-CoV-2 target nucleic acids are DETECTED.  The SARS-CoV-2 RNA is generally detectable in upper respiratory specimens during the acute phase of infection. Positive results are indicative of the presence of the identified virus, but do not rule out bacterial infection or co-infection with other pathogens not detected by the test. Clinical correlation with patient history and other diagnostic information is necessary to determine patient infection status. The expected result is Negative.  Fact Sheet for  Patients: EntrepreneurPulse.com.au  Fact Sheet for Healthcare Providers: IncredibleEmployment.be  This test is not yet approved or cleared by the [place] FDA and  has been authorized for detection and/or diagnosis of SARS-CoV-2 by FDA under an Emergency Use Authorization (EUA).  This EUA will remain in effect (meaning this test can  be used) for the duration of  the COVID-19 declaration under Section 564(b)(1) of the Act, 21 U.S.C. section 360bbb-3(b)(1), unless the authorization is  terminated or revoked sooner.     Influenza A by PCR NEGATIVE NEGATIVE Final   Influenza B by PCR NEGATIVE NEGATIVE Final    Comment: (NOTE) The Xpert Xpress SARS-CoV-2/FLU/RSV plus assay is intended as an aid in the diagnosis of influenza from Nasopharyngeal swab specimens and should not be used as a sole basis for treatment. Nasal washings and aspirates are unacceptable for Xpert Xpress SARS-CoV-2/FLU/RSV testing.  Fact Sheet for Patients: BloggerCourse.com  Fact Sheet for Healthcare Providers: SeriousBroker.it  This test is not yet approved or cleared by the [place] FDA and has been authorized for detection and/or diagnosis of SARS-CoV-2 by FDA under an Emergency Use Authorization (EUA). This EUA will remain in effect (meaning this test can be used) for the duration of the COVID-19 declaration under Section 564(b)(1) of the Act, 21 U.S.C. section 360bbb-3(b)(1), unless the authorization is terminated or revoked.  Performed at [facility], [address] [place], [place] [address]      Labs: CBC: Recent Labs  Lab 04/08/21 1026 04/09/21 0638  WBC 6.1 4.6  NEUTROABS 4.6 3.0  HGB 11.7* 11.3*  HCT 35.3* 33.0*  MCV 84.7 81.5  PLT 219 256   Basic Metabolic Panel: Recent Labs  Lab 04/08/21 1026 04/09/21 0638 04/09/21 0838  NA 131* 133*  --   K 4.1 4.0  --   CL 101 102  --    CO2 21* 23  --   GLUCOSE 188* 99  --   BUN 19 17  --   CREATININE 0.93 0.86  --   CALCIUM 8.5* 8.6*  --   MG  --   --  1.9  PHOS  --   --  3.6   Liver Function Tests: Recent Labs  Lab 04/09/21 0638  AST 17  ALT 11  ALKPHOS 54  BILITOT 0.7  PROT 6.6  ALBUMIN 3.7   No results for input(s): LIPASE, AMYLASE in the last 168 hours. No results for input(s): AMMONIA in the last 168 hours. Cardiac Enzymes: No results for input(s): CKTOTAL, CKMB, CKMBINDEX, TROPONINI in the last 168 hours. BNP (last 3 results) Recent Labs    04/08/21 1535  BNP 225.0*   CBG: No results for input(s): GLUCAP in the last 168 hours.  Time spent: 35 minutes  Signed:  Gillis Santa  [facility]  04/09/2021 2:15 PM

## 2021-04-09 NOTE — TOC Progression Note (Signed)
Transition of Care [facility]) - Progression Note    Patient Details  Name: SAMAYRA HEBEL MRN: [id number] Date of Birth: 09-Nov-1950  [facility]) CM/SW Contact  Bing Quarry, RN Phone Number: 04/09/2021, 2:25 PM  Clinical Narrative:  Code-44 per UR notification. Discussed this billing adjustment with patient and Code-44 explained and signed. Patient to be discharged today. Gabriel Cirri RN CM         Expected Discharge Plan and Services           Expected Discharge Date: 04/09/21                                     Social Determinants of Health (SDOH) Interventions    Readmission Risk Interventions No flowsheet data found.

## 2021-08-02 ENCOUNTER — Other Ambulatory Visit: Payer: Self-pay | Admitting: Internal Medicine

## 2021-08-02 DIAGNOSIS — Z1231 Encounter for screening mammogram for malignant neoplasm of breast: Secondary | ICD-10-CM

## 2021-09-07 ENCOUNTER — Ambulatory Visit
Admission: RE | Admit: 2021-09-07 | Discharge: 2021-09-07 | Disposition: A | Discharge status: Home / Self Care | Payer: Medicare Other | Source: Ambulatory Visit | Attending: Internal Medicine | Admitting: Internal Medicine

## 2021-09-07 DIAGNOSIS — Z1231 Encounter for screening mammogram for malignant neoplasm of breast: Secondary | ICD-10-CM | POA: Diagnosis present

## 2022-08-03 ENCOUNTER — Other Ambulatory Visit: Payer: Self-pay | Admitting: Internal Medicine

## 2022-08-03 DIAGNOSIS — Z1231 Encounter for screening mammogram for malignant neoplasm of breast: Secondary | ICD-10-CM

## 2022-09-12 ENCOUNTER — Ambulatory Visit
Admission: RE | Admit: 2022-09-12 | Discharge: 2022-09-12 | Disposition: A | Discharge status: Home / Self Care | Payer: Medicare Other | Source: Ambulatory Visit | Attending: Internal Medicine | Admitting: Internal Medicine

## 2022-09-12 DIAGNOSIS — Z1231 Encounter for screening mammogram for malignant neoplasm of breast: Secondary | ICD-10-CM | POA: Insufficient documentation

## 2023-07-18 IMAGING — MG MM DIGITAL SCREENING BILAT W/ TOMO AND CAD
8 of 15 series · 9 of 40 positions shown · non-contrast
Comparison: Previous exam(s).

CLINICAL DATA: Screening.

EXAM:
DIGITAL SCREENING BILATERAL MAMMOGRAM WITH TOMOSYNTHESIS AND CAD
TECHNIQUE: Bilateral screening digital craniocaudal and mediolateral oblique
mammograms were obtained. Bilateral screening digital breast
tomosynthesis was performed. The images were evaluated with
computer-aided detection.

[L MLO synth-2D (1 of 2)]
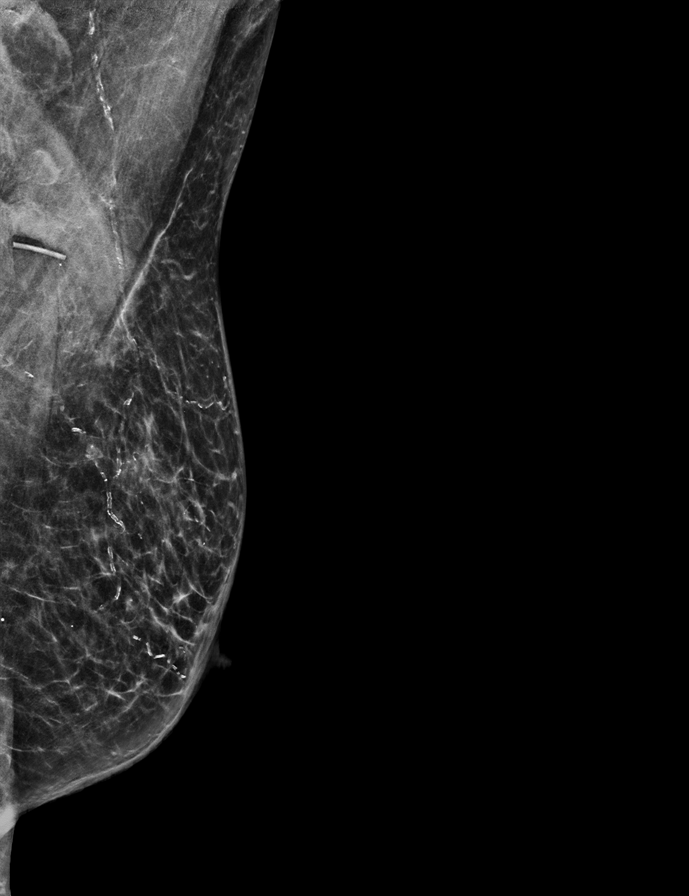

[R MLO synth-2D (1 of 2)]
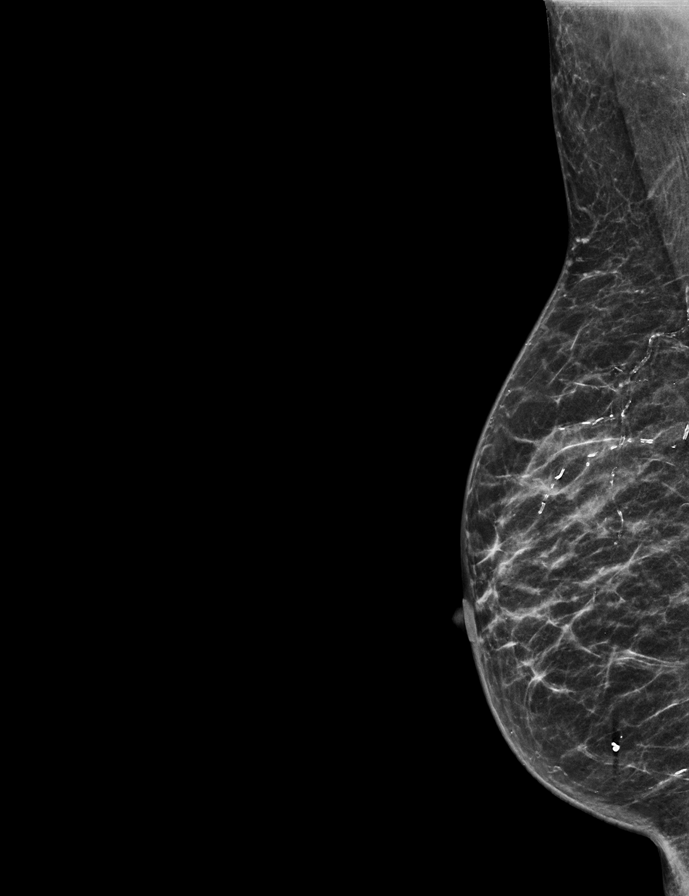

[R CC synth-2D]
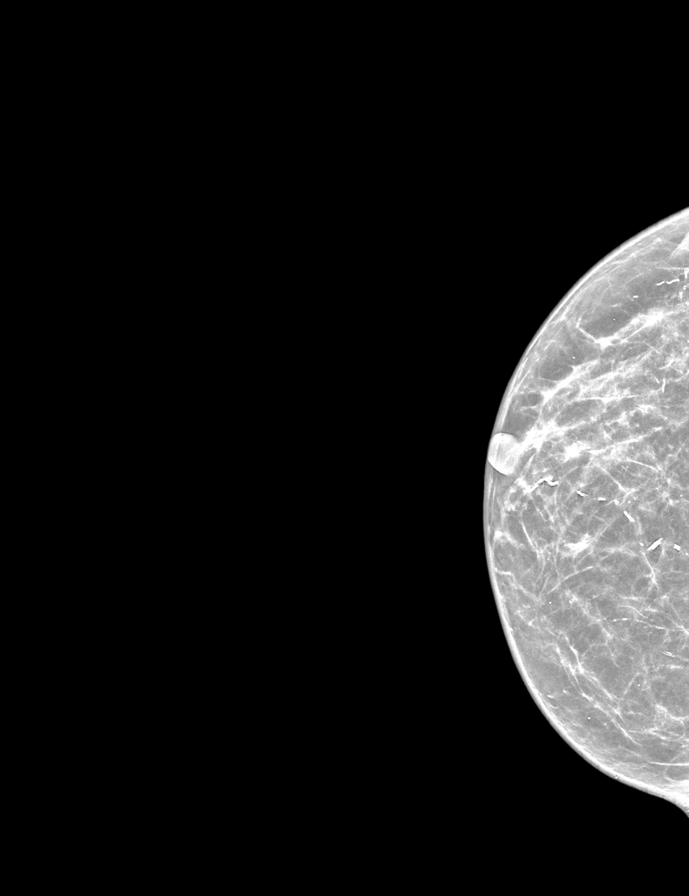

[R XCCL synth-2D]
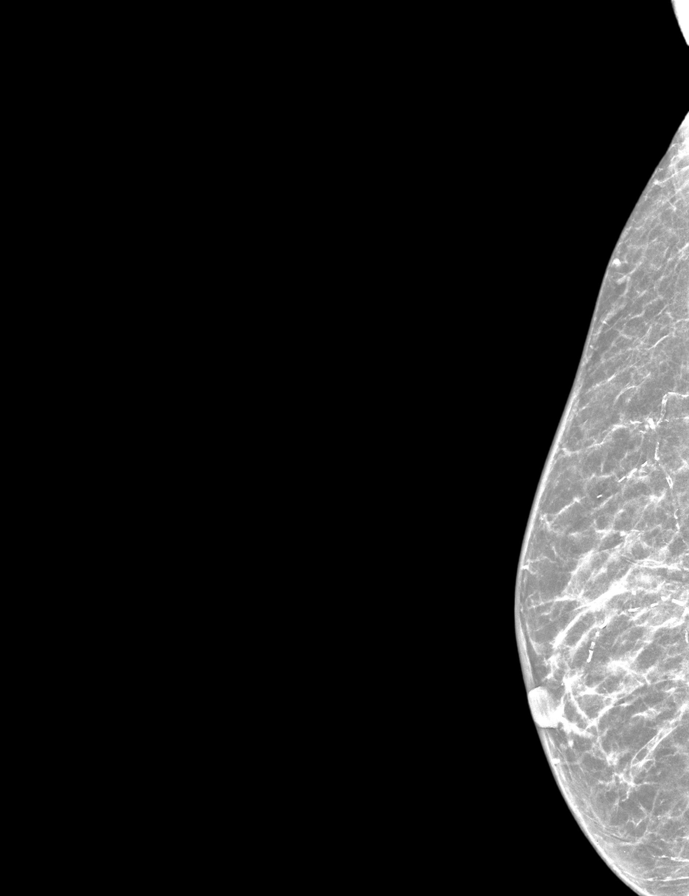

[L XCCL synth-2D]
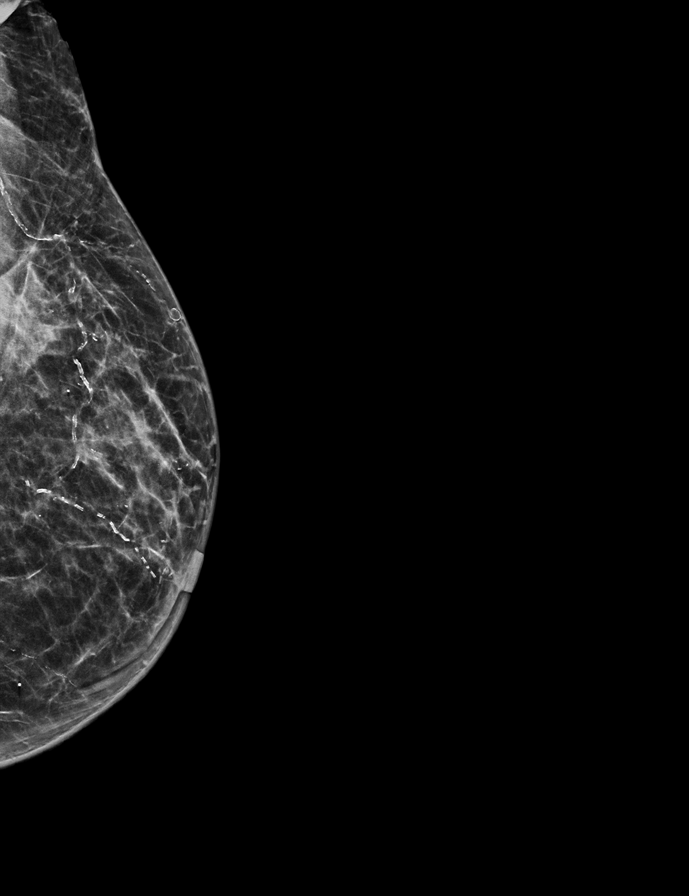

[R MLO synth-2D (2 of 2)]
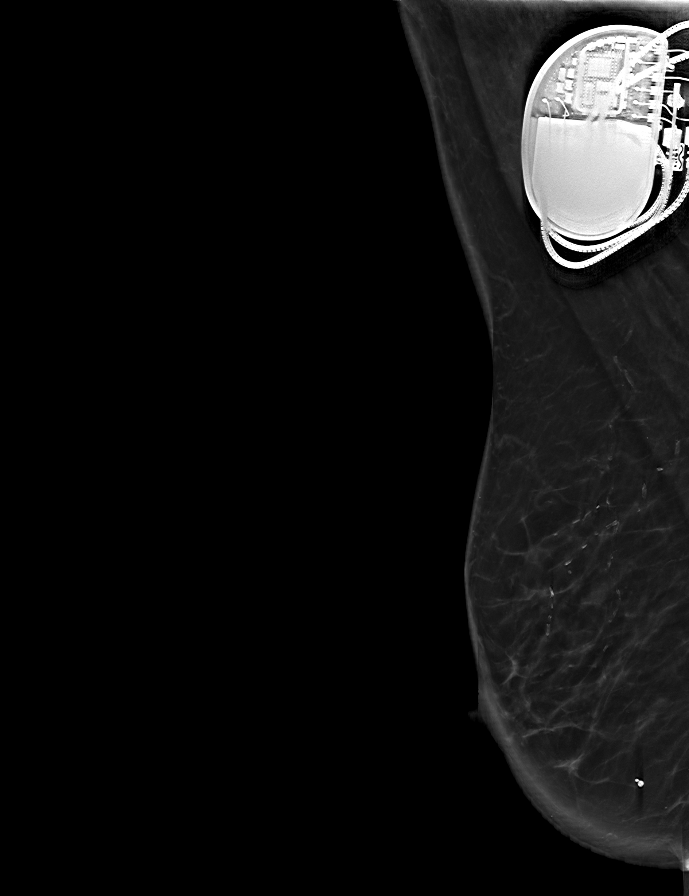

[L MLO synth-2D (2 of 2)]
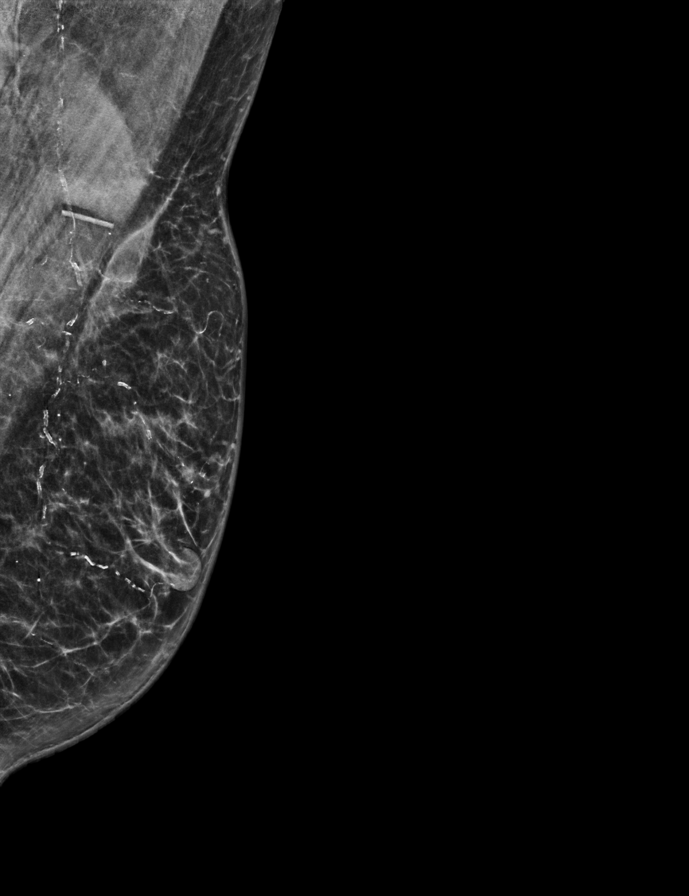

[L XCCL tomo · 2 of 55 frames shown]
[frame 28/55]
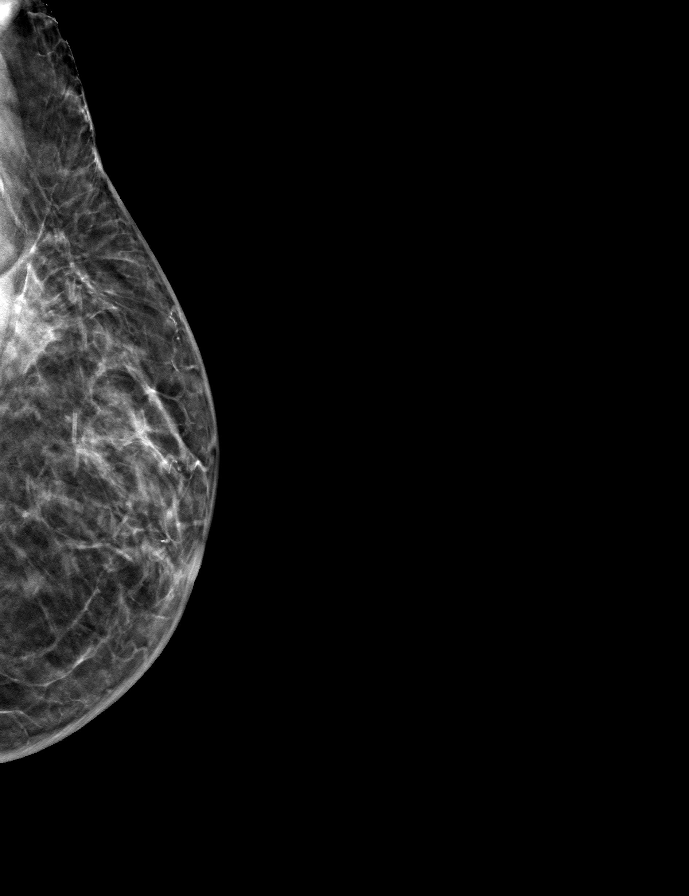
[frame 38/55]
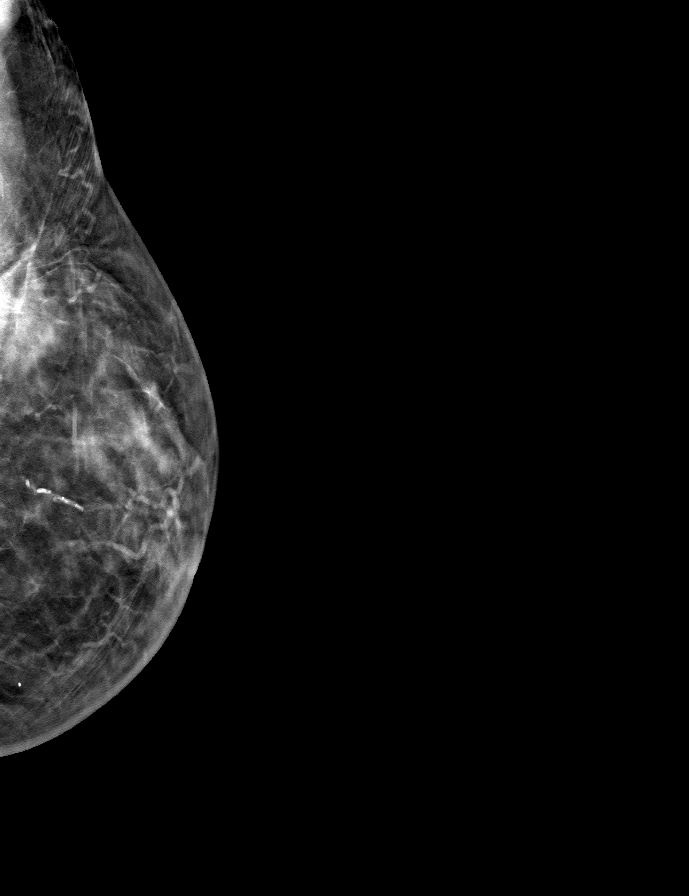

[9 of 40 positions shown; findings below may reference images not displayed]

ACR Breast Density Category b: There are scattered areas of
fibroglandular density.
FINDINGS: There are no findings suspicious for malignancy.
IMPRESSION: No mammographic evidence of malignancy. A result letter of this
screening mammogram will be mailed directly to the patient.

RECOMMENDATION:
Screening mammogram in one year. (Code:51-O-LD2)

BI-RADS CATEGORY  1: Negative.

## 2023-08-15 ENCOUNTER — Other Ambulatory Visit: Payer: Self-pay | Admitting: Internal Medicine

## 2023-08-15 DIAGNOSIS — Z1231 Encounter for screening mammogram for malignant neoplasm of breast: Secondary | ICD-10-CM

## 2023-09-13 ENCOUNTER — Ambulatory Visit
Admission: RE | Admit: 2023-09-13 | Discharge: 2023-09-13 | Disposition: A | Discharge status: Home / Self Care | Source: Ambulatory Visit | Attending: Internal Medicine | Admitting: Internal Medicine

## 2023-09-13 DIAGNOSIS — Z1231 Encounter for screening mammogram for malignant neoplasm of breast: Secondary | ICD-10-CM
